# Patient Record
Sex: Female | Born: 1995
Health system: Southern US, Community
[De-identification: ages and names within clinical notes are randomized; demographics above are authoritative.]

## PROBLEM LIST (undated history)

## (undated) DIAGNOSIS — F411 Generalized anxiety disorder: Secondary | ICD-10-CM

## (undated) DIAGNOSIS — N739 Female pelvic inflammatory disease, unspecified: Secondary | ICD-10-CM

## (undated) DIAGNOSIS — F329 Major depressive disorder, single episode, unspecified: Secondary | ICD-10-CM

## (undated) DIAGNOSIS — F32A Depression, unspecified: Secondary | ICD-10-CM

## (undated) DIAGNOSIS — N946 Dysmenorrhea, unspecified: Secondary | ICD-10-CM

## (undated) DIAGNOSIS — N83202 Unspecified ovarian cyst, left side: Secondary | ICD-10-CM

## (undated) DIAGNOSIS — N83201 Unspecified ovarian cyst, right side: Secondary | ICD-10-CM

## (undated) DIAGNOSIS — N912 Amenorrhea, unspecified: Secondary | ICD-10-CM

## (undated) HISTORY — DX: Major depressive disorder, single episode, unspecified: F32.9

## (undated) HISTORY — DX: Depression, unspecified: F32.A

## (undated) HISTORY — DX: Dysmenorrhea, unspecified: N94.6

## (undated) HISTORY — DX: Amenorrhea, unspecified: N91.2

## (undated) HISTORY — DX: Female pelvic inflammatory disease, unspecified: N73.9

---

## 2018-02-20 ENCOUNTER — Encounter (HOSPITAL_COMMUNITY): Payer: Self-pay

## 2018-02-20 DIAGNOSIS — Z79899 Other long term (current) drug therapy: Secondary | ICD-10-CM | POA: Diagnosis not present

## 2018-02-20 DIAGNOSIS — R102 Pelvic and perineal pain: Secondary | ICD-10-CM | POA: Diagnosis not present

## 2018-02-20 DIAGNOSIS — R1030 Lower abdominal pain, unspecified: Secondary | ICD-10-CM | POA: Diagnosis present

## 2018-02-20 DIAGNOSIS — F411 Generalized anxiety disorder: Secondary | ICD-10-CM | POA: Diagnosis not present

## 2018-02-20 NOTE — ED Triage Notes (Signed)
Pt presents with c/o abdominal cramping. Pt reports that she has had ovarian cysts in the past and has been on birth control. Pt reports that this is her first period since she started her birth control and she is experiencing terrible cramping. Pt reports the cramping was so bad she passed out in her shower. NAD at this time. Pt denies any diarrhea, one episode of vomiting today.

## 2018-02-21 ENCOUNTER — Emergency Department (HOSPITAL_COMMUNITY)
Admission: EM | Admit: 2018-02-21 | Discharge: 2018-02-21 | Disposition: A | Discharge status: Home / Self Care | Payer: BLUE CROSS/BLUE SHIELD | Attending: Emergency Medicine | Admitting: Emergency Medicine

## 2018-02-21 ENCOUNTER — Emergency Department (HOSPITAL_COMMUNITY): Payer: BLUE CROSS/BLUE SHIELD

## 2018-02-21 DIAGNOSIS — R102 Pelvic and perineal pain: Secondary | ICD-10-CM

## 2018-02-21 HISTORY — DX: Generalized anxiety disorder: F41.1

## 2018-02-21 HISTORY — DX: Unspecified ovarian cyst, right side: N83.201

## 2018-02-21 HISTORY — DX: Unspecified ovarian cyst, right side: N83.202

## 2018-02-21 LAB — URINALYSIS, ROUTINE W REFLEX MICROSCOPIC
Bilirubin Urine: NEGATIVE
Glucose, UA: NEGATIVE mg/dL
KETONES UR: 20 mg/dL — AB
Leukocytes, UA: NEGATIVE
Nitrite: NEGATIVE
PH: 5 (ref 5.0–8.0)
PROTEIN: 30 mg/dL — AB
Specific Gravity, Urine: 1.026 (ref 1.005–1.030)

## 2018-02-21 LAB — CBC WITH DIFFERENTIAL/PLATELET
Basophils Absolute: 0 10*3/uL (ref 0.0–0.1)
Basophils Relative: 0 %
Eosinophils Absolute: 0 10*3/uL (ref 0.0–0.7)
Eosinophils Relative: 0 %
HCT: 37.1 % (ref 36.0–46.0)
Hemoglobin: 12.4 g/dL (ref 12.0–15.0)
LYMPHS ABS: 0.6 10*3/uL — AB (ref 0.7–4.0)
LYMPHS PCT: 3 %
MCH: 30.5 pg (ref 26.0–34.0)
MCHC: 33.4 g/dL (ref 30.0–36.0)
MCV: 91.4 fL (ref 78.0–100.0)
Monocytes Absolute: 0.4 10*3/uL (ref 0.1–1.0)
Monocytes Relative: 3 %
Neutro Abs: 15.5 10*3/uL — ABNORMAL HIGH (ref 1.7–7.7)
Neutrophils Relative %: 94 %
PLATELETS: 235 10*3/uL (ref 150–400)
RBC: 4.06 MIL/uL (ref 3.87–5.11)
RDW: 12.4 % (ref 11.5–15.5)
WBC: 16.6 10*3/uL — AB (ref 4.0–10.5)

## 2018-02-21 LAB — BASIC METABOLIC PANEL
Anion gap: 10 (ref 5–15)
BUN: 9 mg/dL (ref 6–20)
CHLORIDE: 106 mmol/L (ref 101–111)
CO2: 23 mmol/L (ref 22–32)
Calcium: 9.4 mg/dL (ref 8.9–10.3)
Creatinine, Ser: 0.44 mg/dL (ref 0.44–1.00)
GFR calc Af Amer: >60 mL/min (ref >60)
GFR calc non Af Amer: >60 mL/min (ref >60)
GLUCOSE: 136 mg/dL — AB (ref 65–99)
POTASSIUM: 3.8 mmol/L (ref 3.5–5.1)
SODIUM: 139 mmol/L (ref 135–145)

## 2018-02-21 LAB — PREGNANCY, URINE: Preg Test, Ur: NEGATIVE

## 2018-02-21 LAB — I-STAT TROPONIN, ED: Troponin i, poc: 0 ng/mL (ref 0.00–0.08)

## 2018-02-21 LAB — WET PREP, GENITAL
CLUE CELLS WET PREP: NONE SEEN
Sperm: NONE SEEN
TRICH WET PREP: NONE SEEN
WBC, Wet Prep HPF POC: NONE SEEN
YEAST WET PREP: NONE SEEN

## 2018-02-21 MED ORDER — LIDOCAINE HCL 1 % IJ SOLN
INTRAMUSCULAR | Status: AC
  Administered 2018-02-21: 0.9 mL
  Filled 2018-02-21: qty 20

## 2018-02-21 MED ORDER — CEFTRIAXONE SODIUM 250 MG IJ SOLR
250.0000 mg | Freq: Once | INTRAMUSCULAR | Status: AC
  Administered 2018-02-21: 250 mg via INTRAMUSCULAR
  Filled 2018-02-21: qty 250

## 2018-02-21 MED ORDER — DOXYCYCLINE HYCLATE 100 MG PO CAPS: 100.0000 mg | ORAL_CAPSULE | Freq: Two times a day (BID) | ORAL | 0 refills | Status: DC

## 2018-02-21 MED ORDER — KETOROLAC TROMETHAMINE 30 MG/ML IJ SOLN
15.0000 mg | Freq: Once | INTRAMUSCULAR | Status: AC
  Administered 2018-02-21: 15 mg via INTRAVENOUS
  Filled 2018-02-21: qty 1

## 2018-02-21 NOTE — ED Provider Notes (Addendum)
[facility]-EMERGENCY DEPT Provider Note   CSN: [id number] Arrival date & time: 02/20/18  2313     History   Chief Complaint Chief Complaint  Patient presents with  . Abdominal Cramping    HPI Elaine Robertson is a 22 y.o. female.  HPI 22 year old Caucasian female past medical history significant for ovarian cyst presents to the emergency department today for evaluation of lower abdominal and pelvic cramping.  The patient states that she has a history of ovarian cyst and has been on birth control.  She states that she stopped her birth control 2 months ago.  States that she started her period for the first time in several years today.  She reports severe cramping in her lower abdomen and pelvic region.  The patient states that she was in the shower today sitting down with the hot water running over her when the pain got so bad that she passed out.  Patient denies any head injury.  Patient presents to the ED for evaluation.  She reports normal flow.  Denies any associated vaginal discharge.  Patient states that she is sexually active with one female partner but has no concern for STD.  Patient denies any associated urinary symptoms.  Denies any associated fevers or chills.  The pain does radiate to her lower back.  Patient took ibuprofen with little relief.  Patient states her pain is approximately a 5 out of 10 at this time.  Patient does report having one episode of vomiting today that was due to the intense pain.  She states that the pain is constant however the intensity is intermittent.  Patient denies any associated chest pain, shortness of breath, lightheadedness, dizziness, vision changes, headache.  Nothing makes her symptoms better or worse.  Pt denies any fever, chill, ha, vision changes, lightheadedness, dizziness, congestion, neck pain, cp, sob, cough,  urinary symptoms, change in bowel habits, melena, hematochezia, lower extremity paresthesias.  Past Medical  History:  Diagnosis Date  . Bilateral ovarian cysts   . Generalized anxiety disorder     There are no active problems to display for this patient.   History reviewed. No pertinent surgical history.   OB History   None      Home Medications    Prior to Admission medications   Medication Sig Start Date End Date Taking? Authorizing Provider  escitalopram (LEXAPRO) 10 MG tablet Take 10 mg by mouth daily.   Yes [provider]  doxycycline (VIBRAMYCIN) 100 MG capsule Take 1 capsule (100 mg total) by mouth 2 (two) times daily. 02/21/18   Rise Mu, PA-C    Family History History reviewed. No pertinent family history.  Social History Social History   Tobacco Use  . Smoking status: Never Smoker  . Smokeless tobacco: Never Used  Substance Use Topics  . Alcohol use: Never    Frequency: Never  . Drug use: Never     Allergies   Patient has no known allergies.   Review of Systems Review of Systems  All other systems reviewed and are negative.    Physical Exam Updated Vital Signs BP 106/64 (BP Location: Left Arm)   Pulse 86   Temp 98.3 F (36.8 C) (Oral)   Resp 17   LMP 02/20/2018 (Approximate)   SpO2 98%   Physical Exam  Constitutional: She is oriented to person, place, and time. She appears well-developed and well-nourished.  Non-toxic appearance. No distress.  HENT:  Head: Normocephalic and atraumatic.  Nose: Nose normal.  Mouth/Throat: Oropharynx is clear and moist.  Eyes: Pupils are equal, round, and reactive to light. Conjunctivae are normal. Right eye exhibits no discharge. Left eye exhibits no discharge.  Neck: Normal range of motion. Neck supple.  Cardiovascular: Normal rate, regular rhythm, normal heart sounds and intact distal pulses. Exam reveals no gallop and no friction rub.  No murmur heard. Pulmonary/Chest: Effort normal and breath sounds normal. No stridor. No respiratory distress. She has no wheezes. She has no rales. She  exhibits no tenderness.  Abdominal: Soft. Bowel sounds are normal. There is tenderness (minimal to deep palpation) in the left lower quadrant. There is no rigidity, no rebound, no guarding, no CVA tenderness, no tenderness at McBurney's point and negative Murphy's sign.  Genitourinary:  Genitourinary Comments: Chaperone present for exam. No external lesions, swelling, erythema, or rash of the labia. No erythema, discharge, bleeding, or lesions noted in the vaginal vault. mild CMT tenderness. No bleeding or friability. No adnexal tenderness, mass or fullness on right. Mild pain with palpation of the adnexa on the left.. No inguinal adenopathy or hernia.    Musculoskeletal: Normal range of motion. She exhibits no tenderness.  Lymphadenopathy:    She has no cervical adenopathy.  Neurological: She is alert and oriented to person, place, and time.  Skin: Skin is warm and dry. Capillary refill takes less than 2 seconds.  Psychiatric: Her behavior is normal. Judgment and thought content normal.  Nursing note and vitals reviewed.    ED Treatments / Results  Labs (all labs ordered are listed, but only abnormal results are displayed) Labs Reviewed  BASIC METABOLIC PANEL - Abnormal; Notable for the following components:      Result Value   Glucose, Bld 136 (*)    All other components within normal limits  CBC WITH DIFFERENTIAL/PLATELET - Abnormal; Notable for the following components:   WBC 16.6 (*)    Neutro Abs 15.5 (*)    Lymphs Abs 0.6 (*)    All other components within normal limits  URINALYSIS, ROUTINE W REFLEX MICROSCOPIC - Abnormal; Notable for the following components:   APPearance CLOUDY (*)    Hgb urine dipstick MODERATE (*)    Ketones, ur 20 (*)    Protein, ur 30 (*)    Bacteria, UA MANY (*)    Squamous Epithelial / LPF 0-5 (*)    All other components within normal limits  WET PREP, GENITAL  URINE CULTURE  PREGNANCY, URINE  POC URINE PREG, ED  I-STAT TROPONIN, ED    GC/CHLAMYDIA PROBE AMP ([place]) NOT AT [facility]    EKG None  Radiology US Transvaginal Non-ob  Result Date: 02/21/2018 CLINICAL DATA:  Pelvic pain for 1 day.  History of ovarian cysts. EXAM: TRANSABDOMINAL AND TRANSVAGINAL ULTRASOUND OF PELVIS DOPPLER ULTRASOUND OF OVARIES TECHNIQUE: Both transabdominal and transvaginal ultrasound examinations of the pelvis were performed. Transabdominal technique was performed for global imaging of the pelvis including uterus, ovaries, adnexal regions, and pelvic cul-de-sac. It was necessary to proceed with endovaginal exam following the transabdominal exam to visualize the ovaries. Color and duplex Doppler ultrasound was utilized to evaluate blood flow to the ovaries. COMPARISON:  None. FINDINGS: Uterus Measurements: 7.0 x 4.3 x 5.2 cm. The uterus is retroverted. No fibroids or other mass visualized. Endometrium Thickness: 13.6 mm.  No focal abnormality visualized. Right ovary Measurements: 3.6 x 1.7 x 1.7 cm. There are physiologic follicles with normal blood flow. Adjacent to the ovary is a echogenic slightly tubular structure measuring 3.9 x 1.0  x 2.9 cm. Left ovary Measurements: 4.0 x 1.7 x 1.8 cm. Normal appearance/no adnexal mass. Normal blood flow. Pulsed Doppler evaluation of both ovaries demonstrates normal low-resistance arterial and venous waveforms. Other findings Small amount of free fluid in the left adnexa. IMPRESSION: 1. Normal sonographic appearance of both ovaries without torsion. No ovarian cyst. 2. Para ovarian echogenic structure in the right adnexa may represent edematous fallopian tube and can be seen with pelvic inflammatory disease. No visualized discrete pelvic fluid collection or abscess. 3. Retroverted uterus which is unremarkable. Electronically Signed   By: Rubye Oaks M.D.   On: 02/21/2018 02:24   US Pelvis Complete  Result Date: 02/21/2018 CLINICAL DATA:  Pelvic pain for 1 day.  History of ovarian cysts. EXAM: TRANSABDOMINAL AND  TRANSVAGINAL ULTRASOUND OF PELVIS DOPPLER ULTRASOUND OF OVARIES TECHNIQUE: Both transabdominal and transvaginal ultrasound examinations of the pelvis were performed. Transabdominal technique was performed for global imaging of the pelvis including uterus, ovaries, adnexal regions, and pelvic cul-de-sac. It was necessary to proceed with endovaginal exam following the transabdominal exam to visualize the ovaries. Color and duplex Doppler ultrasound was utilized to evaluate blood flow to the ovaries. COMPARISON:  None. FINDINGS: Uterus Measurements: 7.0 x 4.3 x 5.2 cm. The uterus is retroverted. No fibroids or other mass visualized. Endometrium Thickness: 13.6 mm.  No focal abnormality visualized. Right ovary Measurements: 3.6 x 1.7 x 1.7 cm. There are physiologic follicles with normal blood flow. Adjacent to the ovary is a echogenic slightly tubular structure measuring 3.9 x 1.0 x 2.9 cm. Left ovary Measurements: 4.0 x 1.7 x 1.8 cm. Normal appearance/no adnexal mass. Normal blood flow. Pulsed Doppler evaluation of both ovaries demonstrates normal low-resistance arterial and venous waveforms. Other findings Small amount of free fluid in the left adnexa. IMPRESSION: 1. Normal sonographic appearance of both ovaries without torsion. No ovarian cyst. 2. Para ovarian echogenic structure in the right adnexa may represent edematous fallopian tube and can be seen with pelvic inflammatory disease. No visualized discrete pelvic fluid collection or abscess. 3. Retroverted uterus which is unremarkable. Electronically Signed   By: Rubye Oaks M.D.   On: 02/21/2018 02:24   [place] Art/ven Flow Abd Pelv Doppler  Result Date: 02/21/2018 CLINICAL DATA:  Pelvic pain for 1 day.  History of ovarian cysts. EXAM: TRANSABDOMINAL AND TRANSVAGINAL ULTRASOUND OF PELVIS DOPPLER ULTRASOUND OF OVARIES TECHNIQUE: Both transabdominal and transvaginal ultrasound examinations of the pelvis were performed. Transabdominal technique was performed for  global imaging of the pelvis including uterus, ovaries, adnexal regions, and pelvic cul-de-sac. It was necessary to proceed with endovaginal exam following the transabdominal exam to visualize the ovaries. Color and duplex Doppler ultrasound was utilized to evaluate blood flow to the ovaries. COMPARISON:  None. FINDINGS: Uterus Measurements: 7.0 x 4.3 x 5.2 cm. The uterus is retroverted. No fibroids or other mass visualized. Endometrium Thickness: 13.6 mm.  No focal abnormality visualized. Right ovary Measurements: 3.6 x 1.7 x 1.7 cm. There are physiologic follicles with normal blood flow. Adjacent to the ovary is a echogenic slightly tubular structure measuring 3.9 x 1.0 x 2.9 cm. Left ovary Measurements: 4.0 x 1.7 x 1.8 cm. Normal appearance/no adnexal mass. Normal blood flow. Pulsed Doppler evaluation of both ovaries demonstrates normal low-resistance arterial and venous waveforms. Other findings Small amount of free fluid in the left adnexa. IMPRESSION: 1. Normal sonographic appearance of both ovaries without torsion. No ovarian cyst. 2. Para ovarian echogenic structure in the right adnexa may represent edematous fallopian tube and can be  seen with pelvic inflammatory disease. No visualized discrete pelvic fluid collection or abscess. 3. Retroverted uterus which is unremarkable. Electronically Signed   By: Rubye Oaks M.D.   On: 02/21/2018 02:24    Procedures Procedures (including critical care time)  Medications Ordered in ED Medications  cefTRIAXone (ROCEPHIN) injection 250 mg (250 mg Intramuscular Given 02/21/18 0438)  ketorolac (TORADOL) 30 MG/ML injection 15 mg (15 mg Intravenous Given 02/21/18 0440)  lidocaine (XYLOCAINE) 1 % (with pres) injection (0.9 mLs  Given 02/21/18 0439)     Initial Impression / Assessment and Plan / ED Course  I have reviewed the triage vital signs and the nursing notes.  Pertinent labs & imaging results that were available during my care of the patient were  reviewed by me and considered in my medical decision making (see chart for details).     Patient presents to the emergency department today for evaluation of lower abdominal cramping and pelvic pain.  Patient has a history of ovarian cyst and has been on oral birth control.  She recently stopped the birth control and started her first period today.  The patient states the pain is very severe cramping.  Reports spotting at this time.  Patient denies any associated vaginal discharge or concern for STD however she is sexually active.  Denies any associated fevers.  Does report episode of vomiting with the pain today and reports an episode of syncope while in the shower due to the intense pain.  Patient overall well-appearing and nontoxic.  Vital signs are reassuring.  Patient is afebrile, no tachycardia or hypotension noted in the ED.  Lungs clear to auscultation bilaterally.  Heart regular rate and rhythm with no rubs murmurs or gallops.  Patient has some minimal left lower quadrant pain to deep palpation otherwise no focal abdominal tenderness noted.  Bowel sounds are present in all 4 quadrants.  No signs of peritonitis.  Pelvic exam reveals mild cervical motion tenderness and some left adnexal tenderness to palpation.  No significant discharge or bleeding noted.  No CVA tenderness.  Lab work shows a leukocytosis of 16,000 with a left shift.  Elect lites are reassuring.  Normal kidney function.  UA with moderate hemoglobin consistent with patient's menstrual cycle, many bacteria with squamous epithelial cells.  There is no leukocytes and is nitrite negative.  Patient denies any urinary symptoms.  Will add culture but will not treat for UTI at this time.  Low suspicion for cystitis or pyelonephritis.  Wet prep shows no acute abnormalities.  Troponin was obtained that was normal.  I suspect the patient's syncope was due to pain given the patient has no chest pain or shortness of breath.  Ultrasound obtained  shows no signs of ovarian torsion or cyst.ara ovarian echogenic structure in the right adnexa may represent edematous fallopian tube and can be seen with pelvic inflammatory disease. No visualized discrete pelvic fluid collection or abscess.   Given the elevated white blood cell count.  Discussed imaging with patient.  Patient does not have any right lower abdominal tenderness on palpation.  Patient states that she does not want a CAT scan at this time.  Have discussed risk of not obtaining CT scan the patient is agreeable.  States that if symptoms worsen she will return the ED.  Given the ultrasound findings with some mild cervical motion tenderness will start patient on Rocephin and doxycycline.  Patient's urine culture is pending however we will not treat at this time.  Negative pregnancy test.  Low suspicion for ectopic pregnancy.  Dicussed with the patient that I cannot rule out appendicitis CT scan the patient understands this.  Pt is hemodynamically stable, in NAD, & able to ambulate in the ED. Evaluation does not show pathology that would require ongoing emergent intervention or inpatient treatment. I explained the diagnosis to the patient. Pain has been managed & has no complaints prior to dc. Pt is comfortable with above plan and is stable for discharge at this time. All questions were answered prior to disposition. Strict return precautions for f/u to the ED were discussed. Encouraged follow up with PCP.  Dicussed with my attending who is agreeable with the above plna.      Final Clinical Impressions(s) / ED Diagnoses   Final diagnoses:  Pelvic pain    ED Discharge Orders        Ordered    doxycycline (VIBRAMYCIN) 100 MG capsule  2 times daily     02/21/18 0426       Rise MuLeaphart, Sanjuan Sawa T, PA-C 02/21/18 [id number]    Shaune PollackIsaacs, Cameron, MD 02/21/18 1406    Rise MuLeaphart, Garyn Arlotta T, PA-C 03/01/18 [id number]    Shaune PollackIsaacs, Cameron, MD 03/01/18 [phone]

## 2018-02-21 NOTE — Discharge Instructions (Addendum)
Your ultrasound did not show any signs of ovarian cyst.  You do have signs of possible pelvic inflammatory disease as discussed.  However I discussed that I would like to perform a CAT scan I understand that you not want this today.  If you develop any specific right lower quadrant pain, fevers or vomiting you need to return the ED immediately to rule out appendicitis.  Have started you on antibiotics to take as prescribed.  Continue the entire course.  May take Motrin and Tylenol for pain with warm compresses to the affected area.  You need to be reevaluated in 1 week if symptoms not improving and return to ED with any worsening symptoms.

## 2018-02-22 LAB — GC/CHLAMYDIA PROBE AMP (~~LOC~~) NOT AT ARMC
Chlamydia: NEGATIVE
NEISSERIA GONORRHEA: NEGATIVE

## 2018-02-22 LAB — URINE CULTURE: CULTURE: NO GROWTH

## 2018-02-25 ENCOUNTER — Ambulatory Visit (HOSPITAL_COMMUNITY): Payer: BLUE CROSS/BLUE SHIELD | Admitting: Psychiatry

## 2018-03-13 DIAGNOSIS — N739 Female pelvic inflammatory disease, unspecified: Secondary | ICD-10-CM

## 2018-03-13 HISTORY — DX: Female pelvic inflammatory disease, unspecified: N73.9

## 2018-07-30 ENCOUNTER — Other Ambulatory Visit: Payer: Self-pay | Admitting: Physician Assistant

## 2018-07-30 DIAGNOSIS — R102 Pelvic and perineal pain: Secondary | ICD-10-CM

## 2018-08-07 ENCOUNTER — Ambulatory Visit
Admission: RE | Admit: 2018-08-07 | Discharge: 2018-08-07 | Disposition: A | Discharge status: Home / Self Care | Payer: BLUE CROSS/BLUE SHIELD | Source: Ambulatory Visit | Attending: Physician Assistant | Admitting: Physician Assistant

## 2018-08-07 DIAGNOSIS — R102 Pelvic and perineal pain: Secondary | ICD-10-CM

## 2018-09-06 ENCOUNTER — Telehealth: Payer: Self-pay | Admitting: Obstetrics and Gynecology

## 2018-09-09 ENCOUNTER — Encounter: Payer: Self-pay | Admitting: *Deleted

## 2018-09-09 ENCOUNTER — Encounter: Payer: Self-pay | Admitting: Obstetrics and Gynecology

## 2018-09-09 ENCOUNTER — Ambulatory Visit: Payer: BLUE CROSS/BLUE SHIELD | Admitting: Obstetrics and Gynecology

## 2018-09-09 ENCOUNTER — Other Ambulatory Visit: Payer: Self-pay

## 2018-09-09 VITALS — BP 108/62 | HR 88 | Resp 16 | Ht 64.0 in | Wt 133.8 lb

## 2018-09-09 DIAGNOSIS — N921 Excessive and frequent menstruation with irregular cycle: Secondary | ICD-10-CM

## 2018-09-09 DIAGNOSIS — R102 Pelvic and perineal pain: Secondary | ICD-10-CM

## 2018-09-09 DIAGNOSIS — N946 Dysmenorrhea, unspecified: Secondary | ICD-10-CM | POA: Diagnosis not present

## 2018-09-09 DIAGNOSIS — N941 Unspecified dyspareunia: Secondary | ICD-10-CM | POA: Diagnosis not present

## 2018-09-09 LAB — POCT URINE PREGNANCY: PREG TEST UR: NEGATIVE

## 2018-09-09 MED ORDER — ETONOGESTREL-ETHINYL ESTRADIOL 0.12-0.015 MG/24HR VA RING: 1.0000 | VAGINAL_RING | VAGINAL | 0 refills | Status: DC

## 2018-09-09 NOTE — Progress Notes (Signed)
22 y.o. [id number] Single Caucasian female here for abnormal pelvic ultrasound revealing Rt.ovarian cyst.    Patient complains of pain with intercourse and pelvic/back pain. Has moderate pelvic pain "24/7" per patient.  Always has cramping around her period.  Always irregular menses also since they started.  States she takes her pills on time.  One late or missed pill in the last 2 - 3 months.   Takes Ibuprofen 400 mg daily when she has pain.   Has had trips to the ER or urgent care due to pain, nausea, and vomiting.   Has used LoLoestrin and had weight gain and fluid retention.  Menses were absent.  Tried another pill, Junel, and felt a little better but did have headaches and weight gain.  Had a one day cycle around the middle of the pack with this.   States a history of eating disorder, so the weight gain has been bothersome to her.   Just started [name] and has not been on it long enough to know if it will be good for her or not.  Had a 3 week cycle at the beginning of the pack.   [facility] and her pelvic exam was painful and felt abnormal. Had pelvic US 08/07/18 showing a 3.1 x 1.7 x 2.5 cm simple right ovarian cyst.  Left ovary and uterus normal. Trace free fluid in pelvis.  Treated for presumed PID in May, 2019.  Cultures were negative per patient.  No change in partner since then.  She also had testing 3 months prior to this, and it was negative.  Just stopped Lexapro, which treated anxiety and depression. Was on for about 5 years.  Sees a therapist.  Denies HTN, migraine with aura, liver or breast disease, personal or FH of DVT/PE.  Student at [employer].   PCP:   Talmage Nap, PA  Patient's last menstrual period was 08/19/2018 (approximate).     Period Duration (Days): 9 days Period Pattern: (!) Irregular Menstrual Flow: Heavy Menstrual Control: Tampon, Other (Comment)(menstrual cup also) Menstrual Control Change Freq (Hours): every 1.5 to 2  hours on heaviest day Dysmenorrhea: (!) Severe Dysmenorrhea Symptoms: Cramping, Nausea, Diarrhea, Headache     Sexually active: Yes.   female The current method of family planning is OCP (estrogen/progesterone).    Exercising: Yes.    dance and yoga Smoker:  no  Health Maintenance: Pap:  never History of abnormal Pap:  n/a MMG:  n/a Colonoscopy:  n/a BMD:   n/a  Result  n/a TDaP:  Up to date Gardasil:   yes HIV:no Hep C:no Screening Labs:   ---   reports that she has never smoked. She has never used smokeless tobacco. She reports that she drinks alcohol. She reports that she does not use drugs.  Past Medical History:  Diagnosis Date  . Amenorrhea   . Bilateral ovarian cysts   . Depression   . Dysmenorrhea   . Generalized anxiety disorder   . PID (pelvic inflammatory disease) 03/2018   STD testing neg    History reviewed. No pertinent surgical history.  Current Outpatient Medications  Medication Sig Dispense Refill  . VIENVA 0.1-20 MG-MCG tablet Take 1 tablet by mouth daily.     No current facility-administered medications for this visit.     Family History  Problem Relation Age of Onset  . Breast cancer Mother 70       she well  . Diabetes Father  type 1  . Hypertension Maternal Grandfather   . Ovarian cancer Paternal Grandmother 68       mets and thought began in ovary  . Cancer Paternal Grandfather 22       pancreatic    Review of Systems  All other systems reviewed and are negative.   Exam:   BP 108/62 (BP Location: Right Arm, Patient Position: Sitting, Cuff Size: Normal)   Pulse 88   Resp 16   Ht 5\' 4"  (1.626 m)   Wt 133 lb 12.8 oz (60.7 kg)   LMP 08/19/2018 (Approximate)   BMI 22.97 kg/m     General appearance: alert, cooperative and appears stated age Head: Normocephalic, without obvious abnormality, atraumatic Neck: no adenopathy, supple, symmetrical, trachea midline and thyroid normal to inspection and palpation Lungs: clear to  auscultation bilaterally Heart: regular rate and rhythm Abdomen: soft, non-tender; no masses, no organomegaly Extremities: extremities normal, atraumatic, no cyanosis or edema Skin: Skin color, texture, turgor normal. No rashes or lesions Lymph nodes: Cervical, supraclavicular, and axillary nodes normal. No abnormal inguinal nodes palpated Neurologic: Grossly normal  Pelvic: External genitalia:  no lesions              Urethra:  normal appearing urethra with no masses, tenderness or lesions              Bartholins and Skenes: normal                 Vagina: normal appearing vagina with normal color and discharge, no lesions              Cervix: no lesions        Bimanual Exam:  Uterus:  normal size, contour, position, consistency, mobility, non-tender, retroverted.               Adnexa: no mass, fullness, tenderness              Rectal exam: Yes.  .  Confirms.              Anus:  normal sphincter tone, no lesions  Chaperone was present for exam.  Assessment:    Pelvic pain/dysmenorrhea/dyspareunia.  Irregular cycles on low dose and ultra low dose OCPs. Treated for presumed PID this year.  Negative cultures.  Simple ovarian cyst - right  FH breast cancer.  Mother in early 13s.   Unknown genetic test results.  FH ovarian cancer.  Paternal GM - presumed metastatic ovarian cancer.   Plan:   UPT now. Comprehensive discussion of pelvic pain.  We reviewed options of combined contraception, POP, progesterone IUDs, Depo Provera, Pollie Friar, Depo Lupron, laparoscopy.  She will opt for NuvaRing.  Instructed in use.  She may do continuous use of the ring.  We discussed Erlene Quan, and Mirena in detail - risks and benefits. HO on [place]. Cautioned against use of too much NSAID.  Do not mix regular NSAID and SSRIs. Follow up in 3 months.   ___45____ minutes face to face time of which over 50% was spent in counseling.    After visit summary provided.

## 2018-09-09 NOTE — Patient Instructions (Signed)
Ethinyl Estradiol; Etonogestrel vaginal ring What is this medicine? ETHINYL ESTRADIOL; ETONOGESTREL (ETH in il es tra DYE ole; et oh noe JES trel) vaginal ring is a flexible, vaginal ring used as a contraceptive (birth control method). This medicine combines two types of female hormones, an estrogen and a progestin. This ring is used to prevent ovulation and pregnancy. Each ring is effective for one month. This medicine may be used for other purposes; ask your health care provider or pharmacist if you have questions. COMMON BRAND NAME(S): NuvaRing What should I tell my health care provider before I take this medicine? They need to know if you have or ever had any of these conditions: -abnormal vaginal bleeding -blood vessel disease or blood clots -breast, cervical, endometrial, ovarian, liver, or uterine cancer -diabetes -gallbladder disease -heart disease or recent heart attack -high blood pressure -high cholesterol -kidney disease -liver disease -migraine headaches -stroke -systemic lupus erythematosus (SLE) -tobacco smoker -an unusual or allergic reaction to estrogens, progestins, other medicines, foods, dyes, or preservatives -pregnant or trying to get pregnant -breast-feeding How should I use this medicine? Insert the ring into your vagina as directed. Follow the directions on the prescription label. The ring will remain place for 3 weeks and is then removed for a 1-week break. A new ring is inserted 1 week after the last ring was removed, on the same day of the week. Check often to make sure the ring is still in place, especially before and after sexual intercourse. If the ring was out of the vagina for an unknown amount of time, you may not be protected from pregnancy. Perform a pregnancy test and call your doctor. Do not use more often than directed. A patient package insert for the product will be given with each prescription and refill. Read this sheet carefully each time. The  sheet may change frequently. Contact your pediatrician regarding the use of this medicine in children. Special care may be needed. This medicine has been used in female children who have started having menstrual periods. Overdosage: If you think you have taken too much of this medicine contact a poison control center or emergency room at once. NOTE: This medicine is only for you. Do not share this medicine with others. What if I miss a dose? You will need to replace your vaginal ring once a month as directed. If the ring should slip out, or if you leave it in longer or shorter than you should, contact your health care professional for advice. What may interact with this medicine? Do not take this medicine with the following medication: -dasabuvir; ombitasvir; paritaprevir; ritonavir -ombitasvir; paritaprevir; ritonavir This medicine may also interact with the following medications: -acetaminophen -antibiotics or medicines for infections, especially rifampin, rifabutin, rifapentine, and griseofulvin, and possibly penicillins or tetracyclines -aprepitant -ascorbic acid (vitamin C) -atorvastatin -barbiturate medicines, such as phenobarbital -bosentan -carbamazepine -caffeine -clofibrate -cyclosporine -dantrolene -doxercalciferol -felbamate -grapefruit juice -hydrocortisone -medicines for anxiety or sleeping problems, such as diazepam or temazepam -medicines for diabetes, including pioglitazone -modafinil -mycophenolate -nefazodone -oxcarbazepine -phenytoin -prednisolone -ritonavir or other medicines for HIV infection or AIDS -rosuvastatin -selegiline -soy isoflavones supplements -St. John's wort -tamoxifen or raloxifene -theophylline -thyroid hormones -topiramate -warfarin This list may not describe all possible interactions. Give your health care provider a list of all the medicines, herbs, non-prescription drugs, or dietary supplements you use. Also tell them if you smoke,  drink alcohol, or use illegal drugs. Some items may interact with your medicine. What should I watch for while using   this medicine? Visit your doctor or health care professional for regular checks on your progress. You will need a regular breast and pelvic exam and Pap smear while on this medicine. Use an additional method of contraception during the first cycle that you use this ring. Do not use a diaphragm or female condom, as the ring can interfere with these birth control methods and their proper placement. If you have any reason to think you are pregnant, stop using this medicine right away and contact your doctor or health care professional. If you are using this medicine for hormone related problems, it may take several cycles of use to see improvement in your condition. Smoking increases the risk of getting a blood clot or having a stroke while you are using hormonal birth control, especially if you are more than 20 years old. You are strongly advised not to smoke. This medicine can make your body retain fluid, making your fingers, hands, or ankles swell. Your blood pressure can go up. Contact your doctor or health care professional if you feel you are retaining fluid. This medicine can make you more sensitive to the sun. Keep out of the sun. If you cannot avoid being in the sun, wear protective clothing and use sunscreen. Do not use sun lamps or tanning beds/booths. If you wear contact lenses and notice visual changes, or if the lenses begin to feel uncomfortable, consult your eye care specialist. In some women, tenderness, swelling, or minor bleeding of the gums may occur. Notify your dentist if this happens. Brushing and flossing your teeth regularly may help limit this. See your dentist regularly and inform your dentist of the medicines you are taking. If you are going to have elective surgery, you may need to stop using this medicine before the surgery. Consult your health care professional  for advice. This medicine does not protect you against HIV infection (AIDS) or any other sexually transmitted diseases. What side effects may I notice from receiving this medicine? Side effects that you should report to your doctor or health care professional as soon as possible: -breast tissue changes or discharge -changes in vaginal bleeding during your period or between your periods -chest pain -coughing up blood -dizziness or fainting spells -headaches or migraines -leg, arm or groin pain -severe or sudden headaches -stomach pain (severe) -sudden shortness of breath -sudden loss of coordination, especially on one side of the body -speech problems -symptoms of vaginal infection like itching, irritation or unusual discharge -tenderness in the upper abdomen -vomiting -weakness or numbness in the arms or legs, especially on one side of the body -yellowing of the eyes or skin Side effects that usually do not require medical attention (report to your doctor or health care professional if they continue or are bothersome): -breakthrough bleeding and spotting that continues beyond the 3 initial cycles of pills -breast tenderness -mood changes, anxiety, depression, frustration, anger, or emotional outbursts -increased sensitivity to sun or ultraviolet light -nausea -skin rash, acne, or brown spots on the skin -weight gain (slight) This list may not describe all possible side effects. Call your doctor for medical advice about side effects. You may report side effects to FDA at 1-800-FDA-1088. Where should I keep my medicine? Keep out of the reach of children. Store at room temperature between 15 and 30 degrees C (59 and 86 degrees F) for up to 4 months. The product will expire after 4 months. Protect from light. Throw away any unused medicine after the expiration date. NOTE: This   sheet is a summary. It may not cover all possible information. If you have questions about this medicine, talk  to your doctor, pharmacist, or health care provider.  2018 Elsevier/Gold Standard (2016-07-07 17:00:31)  

## 2018-09-09 NOTE — Addendum Note (Signed)
Addended by: Alphonsa Overall on: 09/09/2018 11:42 AM   Modules accepted: Orders

## 2018-10-09 ENCOUNTER — Encounter: Payer: Self-pay | Admitting: Obstetrics and Gynecology

## 2018-10-23 ENCOUNTER — Other Ambulatory Visit: Payer: Self-pay | Admitting: Obstetrics and Gynecology

## 2018-10-23 MED ORDER — ETONOGESTREL-ETHINYL ESTRADIOL 0.12-0.015 MG/24HR VA RING: 1.0000 | VAGINAL_RING | VAGINAL | 0 refills | Status: DC

## 2018-10-23 NOTE — Telephone Encounter (Signed)
Patient is home for break from school and needs refills of her birth control called to [facility], [place], [place] [phone]

## 2018-10-23 NOTE — Telephone Encounter (Signed)
Medication refill request: NuvaRing Last OV:  09/09/18 - Pelvic Pain Next OV: 12/11/18 Last MMG (if hormonal medication request): n/a Refill authorized: 09/09/18 #3 w/0 refills; today please advise

## 2018-12-11 ENCOUNTER — Other Ambulatory Visit: Payer: Self-pay

## 2018-12-11 ENCOUNTER — Encounter: Payer: Self-pay | Admitting: Obstetrics and Gynecology

## 2018-12-11 ENCOUNTER — Ambulatory Visit (INDEPENDENT_AMBULATORY_CARE_PROVIDER_SITE_OTHER): Payer: BLUE CROSS/BLUE SHIELD | Admitting: Obstetrics and Gynecology

## 2018-12-11 VITALS — BP 110/72 | HR 66 | Resp 14 | Ht 65.0 in | Wt 133.1 lb

## 2018-12-11 DIAGNOSIS — Z3009 Encounter for other general counseling and advice on contraception: Secondary | ICD-10-CM | POA: Diagnosis not present

## 2018-12-11 MED ORDER — ETONOGESTREL-ETHINYL ESTRADIOL 0.12-0.015 MG/24HR VA RING
1.0000 | VAGINAL_RING | VAGINAL | 8 refills | Status: DC
Start: 2018-12-11 — End: 2019-02-28

## 2018-12-11 NOTE — Progress Notes (Signed)
GYNECOLOGY  VISIT   HPI: 23 y.o.   Single  Caucasian  female   G0P0000 with Patient's last menstrual period was 12/02/2018.   here for 3 month follow up.    Has dysmenorrhea, pelvic pain and dysmenorrhea.   Simple right ovarian cyst by US.  Had irregular menses on low dose OCPs.   Started NuvaRing after her visit on 09/09/18.  States her pain and bleeding are so much better. Bleeds for 5 days when she is off of the Ring.  Occasional spotting.  Decreasing her use of NSAIDs, and does use Ibuprofen or Aleve during the first 2 - 3 days of her cycle.  Had some headache and dizziness the first month of use, but this went away.   Really likes NuvaRing.  Leaves it in for sex.    GYNECOLOGIC HISTORY: Patient's last menstrual period was 12/02/2018. Contraception: Nuvaring Menopausal hormone therapy:  none Last mammogram: n/a Last pap smear:   n/a        OB History    Gravida  0   Para  0   Term  0   Preterm  0   AB  0   Living  0     SAB  0   TAB  0   Ectopic  0   Multiple  0   Live Births  0              Patient Active Problem List   Diagnosis Date Noted  . Pelvic pain 09/09/2018  . Dysmenorrhea 09/09/2018  . Dyspareunia, female 09/09/2018    Past Medical History:  Diagnosis Date  . Amenorrhea   . Bilateral ovarian cysts   . Depression   . Dysmenorrhea   . Generalized anxiety disorder   . PID (pelvic inflammatory disease) 03/2018   STD testing neg    History reviewed. No pertinent surgical history.  Current Outpatient Medications  Medication Sig Dispense Refill  . etonogestrel-ethinyl estradiol (NUVARING) 0.12-0.015 MG/24HR vaginal ring Place 1 each vaginally every 28 (twenty-eight) days. Insert vaginally and leave in place for 3 consecutive weeks, then remove for 1 week. 3 each 0   No current facility-administered medications for this visit.      ALLERGIES: Patient has no known allergies.  Family History  Problem Relation Age of  Onset  . Breast cancer Mother 1339       she well  . Diabetes Father        type 1  . Hypertension Maternal Grandfather   . Ovarian cancer Paternal Grandmother 68       mets and thought began in ovary  . Cancer Paternal Grandfather [id number]       pancreatic    Social History   Socioeconomic History  . Marital status: Single    Spouse name: Not on file  . Number of children: Not on file  . Years of education: Not on file  . Highest education level: Not on file  Occupational History  . Not on file  Social Needs  . Financial resource strain: Not on file  . Food insecurity:    Worry: Not on file    Inability: Not on file  . Transportation needs:    Medical: Not on file    Non-medical: Not on file  Tobacco Use  . Smoking status: Never Smoker  . Smokeless tobacco: Never Used  Substance and Sexual Activity  . Alcohol use: Yes    Frequency: Never    Comment: occ. glass  of wine  . Drug use: Never  . Sexual activity: Yes    Birth control/protection: Pill, Condom    Comment: [name]  Lifestyle  . Physical activity:    Days per week: Not on file    Minutes per session: Not on file  . Stress: Not on file  Relationships  . Social connections:    Talks on phone: Not on file    Gets together: Not on file    Attends religious service: Not on file    Active member of club or organization: Not on file    Attends meetings of clubs or organizations: Not on file    Relationship status: Not on file  . Intimate partner violence:    Fear of current or ex partner: Not on file    Emotionally abused: Not on file    Physically abused: Not on file    Forced sexual activity: Not on file  Other Topics Concern  . Not on file  Social History Narrative  . Not on file    Review of Systems  Constitutional: Negative.   HENT: Negative.   Eyes: Negative.   Respiratory: Negative.   Cardiovascular: Negative.   Gastrointestinal: Negative.   Endocrine: Negative.   Genitourinary: Negative.    Musculoskeletal: Negative.   Skin: Negative.   Allergic/Immunologic: Negative.   Neurological: Negative.   Hematological: Negative.   Psychiatric/Behavioral: Negative.     PHYSICAL EXAMINATION:    BP 110/72 (BP Location: Right Arm, Patient Position: Sitting, Cuff Size: Normal)   Pulse 66   Resp 14   Ht 5\' 5"  (1.651 m)   Wt 133 lb 1.6 oz (60.4 kg)   LMP 12/02/2018   BMI 22.15 kg/m     General appearance: alert, cooperative and appears stated age   ASSESSMENT  Nuva Ring surveillance.  Dysmenorrhea, pelvic pain and irregular bleeding controlled on current therapy. PLAN  Continue Nuva Ring x 9 more months.  FU for annual exam in October 2020.   An After Visit Summary was printed and given to the patient.  __15____ minutes face to face time of which over 50% was spent in counseling.

## 2019-02-28 ENCOUNTER — Other Ambulatory Visit: Payer: Self-pay | Admitting: Obstetrics and Gynecology

## 2019-02-28 MED ORDER — ETONOGESTREL-ETHINYL ESTRADIOL 0.12-0.015 MG/24HR VA RING
1.0000 | VAGINAL_RING | VAGINAL | 1 refills | Status: DC
Start: 2019-02-28 — End: 2019-08-18

## 2019-02-28 NOTE — Telephone Encounter (Signed)
Patient left voicemail requesting a refill for Nuvaring be sent to [facility] [address] in [place], [place].

## 2019-02-28 NOTE — Telephone Encounter (Signed)
Medication refill request: Nuvaring  Last AEX:  12/11/18 Dr. Edward Jolly  Next AEX: none Last MMG (if hormonal medication request): none Refill authorized: 12/11/18 #1each/8R. To [facility].  Patient requesting Rx to her local pharmacy please advise.

## 2019-03-05 ENCOUNTER — Telehealth: Payer: Self-pay | Admitting: Obstetrics and Gynecology

## 2019-03-05 NOTE — Telephone Encounter (Signed)
Patient is calling again requesting an update for her refill request. She sttes the [facility] tells her she has no more refills . She thinks the pharmacy is looking at an old prescription and a new Nuvoring will need to be sent to [facility] [address], [place]

## 2019-03-05 NOTE — Telephone Encounter (Signed)
Patient notified that pharmacy stated her rx is ready for her to pick up.

## 2019-03-05 NOTE — Telephone Encounter (Signed)
rx was sent electronically already.

## 2019-03-05 NOTE — Telephone Encounter (Signed)
[facility] left a message on the answering machine late yesterday asking for a refill of Nuvoring for this patient. Marland Kitchen

## 2019-08-18 ENCOUNTER — Other Ambulatory Visit: Payer: Self-pay

## 2019-08-18 MED ORDER — ETONOGESTREL-ETHINYL ESTRADIOL 0.12-0.015 MG/24HR VA RING: 1.0000 | VAGINAL_RING | VAGINAL | 0 refills | Status: DC

## 2019-08-18 NOTE — Telephone Encounter (Signed)
Medication refill request: Nuvaring Last OV: 12/11/2018 BS  Next AEX: 09/11/2019 Last MMG (if hormonal medication request): n/a Refill authorized: Pending #3 with no refills if appropriate. Please advise.

## 2019-08-22 ENCOUNTER — Other Ambulatory Visit: Payer: Self-pay | Admitting: Emergency Medicine

## 2019-08-22 DIAGNOSIS — Z20822 Contact with and (suspected) exposure to covid-19: Secondary | ICD-10-CM

## 2019-08-24 LAB — NOVEL CORONAVIRUS, NAA: SARS-CoV-2, NAA: NOT DETECTED

## 2019-09-11 ENCOUNTER — Encounter: Payer: Self-pay | Admitting: Obstetrics and Gynecology

## 2019-09-11 ENCOUNTER — Ambulatory Visit: Payer: BC Managed Care – PPO | Admitting: Obstetrics and Gynecology

## 2019-09-11 ENCOUNTER — Other Ambulatory Visit (HOSPITAL_COMMUNITY)
Admission: RE | Admit: 2019-09-11 | Discharge: 2019-09-11 | Disposition: A | Discharge status: Home / Self Care | Payer: BC Managed Care – PPO | Source: Ambulatory Visit | Attending: Obstetrics and Gynecology | Admitting: Obstetrics and Gynecology

## 2019-09-11 ENCOUNTER — Other Ambulatory Visit: Payer: Self-pay

## 2019-09-11 VITALS — BP 106/70 | HR 76 | Temp 98.0°F | Resp 14 | Ht 64.5 in | Wt 128.0 lb

## 2019-09-11 DIAGNOSIS — Z01419 Encounter for gynecological examination (general) (routine) without abnormal findings: Secondary | ICD-10-CM

## 2019-09-11 DIAGNOSIS — Z113 Encounter for screening for infections with a predominantly sexual mode of transmission: Secondary | ICD-10-CM | POA: Diagnosis present

## 2019-09-11 DIAGNOSIS — Z803 Family history of malignant neoplasm of breast: Secondary | ICD-10-CM

## 2019-09-11 DIAGNOSIS — Z8041 Family history of malignant neoplasm of ovary: Secondary | ICD-10-CM

## 2019-09-11 DIAGNOSIS — Z800 Family history of malignant neoplasm of digestive organs: Secondary | ICD-10-CM | POA: Diagnosis not present

## 2019-09-11 MED ORDER — ETONOGESTREL-ETHINYL ESTRADIOL 0.12-0.015 MG/24HR VA RING: 1.0000 | VAGINAL_RING | VAGINAL | 3 refills | Status: DC

## 2019-09-11 NOTE — Patient Instructions (Signed)

## 2019-09-11 NOTE — Progress Notes (Signed)
23 y.o. G0P0000 Single Caucasian female here for annual exam.    Studying for the Law entrance exam.   Having a period every month.  Cramping and pain controlled.  PCP:   Does not have one  Patient's last menstrual period was 08/21/2019 (exact date).           Sexually active: Yes.    The current method of family planning is NuvaRing vaginal inserts.    Exercising: Yes.    Exercise at home.  Smoker:  no  Health Maintenance: Pap: NEVER History of abnormal Pap:  n/a MMG:  n/a Colonoscopy:  n/a BMD:   n/a  Result  n/a TDaP:  2018 Gardasil:   yes HIV: today Hep C: today Screening Labs:  Declines Flu vaccine:  Completed.   reports that she has never smoked. She has never used smokeless tobacco. She reports current alcohol use. She reports that she does not use drugs.  Past Medical History:  Diagnosis Date  . Amenorrhea   . Bilateral ovarian cysts   . Depression   . Dysmenorrhea   . Generalized anxiety disorder   . PID (pelvic inflammatory disease) 03/2018   STD testing neg    No past surgical history on file.  Current Outpatient Medications  Medication Sig Dispense Refill  . etonogestrel-ethinyl estradiol (NUVARING) 0.12-0.015 MG/24HR vaginal ring Place 1 each vaginally every 28 (twenty-eight) days. Insert vaginally and leave in place for 3 consecutive weeks, then remove for 1 week. 1 each 0   No current facility-administered medications for this visit.     Family History  Problem Relation Age of Onset  . Breast cancer Mother 27       she well  . Diabetes Father        type 1  . Hypertension Maternal Grandfather   . Ovarian cancer Paternal Grandmother 41       mets and thought began in ovary  . Cancer Paternal Grandfather 33       pancreatic    Review of Systems  Constitutional: Negative.   HENT: Negative.   Eyes: Negative.   Respiratory: Negative.   Cardiovascular: Negative.   Gastrointestinal: Negative.   Endocrine: Negative.   Genitourinary:  Negative.   Musculoskeletal: Negative.   Skin: Negative.   Allergic/Immunologic: Negative.   Neurological: Negative.   Hematological: Negative.   Psychiatric/Behavioral: Negative.     Exam:   BP 106/70 (BP Location: Right Arm, Patient Position: Sitting, Cuff Size: Normal)   Pulse 76   Temp 98 F (36.7 C) (Temporal)   Resp 14   Ht 5' 4.5" (1.638 m)   Wt 128 lb (58.1 kg)   LMP 08/21/2019 (Exact Date)   BMI 21.63 kg/m     General appearance: alert, cooperative and appears stated age Head: normocephalic, without obvious abnormality, atraumatic Neck: no adenopathy, supple, symmetrical, trachea midline and thyroid normal to inspection and palpation Lungs: clear to auscultation bilaterally Breasts: normal appearance, no masses or tenderness, No nipple retraction or dimpling, No nipple discharge or bleeding, No axillary adenopathy Heart: regular rate and rhythm Abdomen: soft, non-tender; no masses, no organomegaly Extremities: extremities normal, atraumatic, no cyanosis or edema Skin: skin color, texture, turgor normal. No rashes or lesions Lymph nodes: cervical, supraclavicular, and axillary nodes normal. Neurologic: grossly normal  Pelvic: External genitalia:  no lesions              No abnormal inguinal nodes palpated.  Urethra:  normal appearing urethra with no masses, tenderness or lesions              Bartholins and Skenes: normal                 Vagina: normal appearing vagina with normal color and discharge, no lesions              Cervix: no lesions              Pap taken: Yes.   Bimanual Exam:  Uterus:  normal size, contour, position, consistency, mobility, non-tender              Adnexa: no mass, fullness, tenderness             Chaperone was present for exam.  Assessment:   Well woman visit with normal exam. FH breast cancer.  Mother in early 84s.   Unknown genetic test results.  FH ovarian cancer.  Paternal GM - presumed metastatic ovarian cancer.    Plan: Mammogram screening likely around age 7-30.  Self breast awareness reviewed. Pap and HR HPV as above. Guidelines for Calcium, Vitamin D, regular exercise program including cardiovascular and weight bearing exercise. Referral for genetic counseling and testing.  Refill of Nuvaring.  STD screening.  She declines routine labs. Follow up annually and prn.   After visit summary provided.

## 2019-09-12 LAB — HEP, RPR, HIV PANEL
HIV Screen 4th Generation wRfx: NONREACTIVE
Hepatitis B Surface Ag: NEGATIVE
RPR Ser Ql: NONREACTIVE

## 2019-09-12 LAB — HEPATITIS C ANTIBODY: Hep C Virus Ab: <0.1 s/co ratio (ref 0.0–0.9)

## 2019-09-17 LAB — CYTOLOGY - PAP
Chlamydia: NEGATIVE
Comment: NEGATIVE
Comment: NEGATIVE
Comment: NORMAL
Diagnosis: NEGATIVE
Neisseria Gonorrhea: NEGATIVE
Trichomonas: NEGATIVE

## 2019-09-24 ENCOUNTER — Telehealth: Payer: Self-pay | Admitting: *Deleted

## 2019-09-24 NOTE — Telephone Encounter (Signed)
Message left to return call to [facility] at [phone].   One year supply of nuvaring sent to [facility] on 09-05-2019. Patient needs to advise where prescription needs to be sent.

## 2019-10-06 ENCOUNTER — Other Ambulatory Visit: Payer: Self-pay

## 2019-10-06 DIAGNOSIS — Z20822 Contact with and (suspected) exposure to covid-19: Secondary | ICD-10-CM

## 2019-10-07 LAB — NOVEL CORONAVIRUS, NAA: SARS-CoV-2, NAA: NOT DETECTED

## 2019-10-07 NOTE — Telephone Encounter (Signed)
Message left to return call to Christoph Copelan at [phone].    

## 2019-10-13 NOTE — Telephone Encounter (Signed)
Patient has not returned call x2 to advise if year supply of nuvaring was sent to correct pharmacy on 09-11-2019. Okay to close encounter?   Routing to provider for review.

## 2019-10-14 NOTE — Telephone Encounter (Signed)
Encounter reviewed and closed.  

## 2019-11-28 ENCOUNTER — Encounter (HOSPITAL_COMMUNITY): Payer: Self-pay | Admitting: Psychiatry

## 2019-11-28 ENCOUNTER — Ambulatory Visit (INDEPENDENT_AMBULATORY_CARE_PROVIDER_SITE_OTHER): Payer: BC Managed Care – PPO | Admitting: Psychiatry

## 2019-11-28 ENCOUNTER — Other Ambulatory Visit: Payer: Self-pay

## 2019-11-28 DIAGNOSIS — F411 Generalized anxiety disorder: Secondary | ICD-10-CM | POA: Diagnosis not present

## 2019-11-28 DIAGNOSIS — F633 Trichotillomania: Secondary | ICD-10-CM | POA: Diagnosis not present

## 2019-11-28 MED ORDER — FLUOXETINE HCL 20 MG PO TABS: 20.0000 mg | ORAL_TABLET | Freq: Every day | ORAL | 0 refills | Status: DC

## 2019-11-28 MED ORDER — TRAZODONE HCL 50 MG PO TABS: 50.0000 mg | ORAL_TABLET | Freq: Every evening | ORAL | 0 refills | Status: DC | PRN

## 2019-11-28 NOTE — Progress Notes (Signed)
Psychiatric Initial Adult Assessment   Patient Identification: Elaine Robertson MRN:  [id number] Date of Evaluation:  11/28/2019 Referral Source: Talmage Nap PA Chief Complaint:   Chief Complaint    Establish Care; Anxiety     Interview was conducted using WebEx teleconferencing application and I verified that I was speaking with the correct person using two identifiers. I discussed the limitations of evaluation and management by telemedicine and  the availability of in person appointments. Patient expressed understanding and agreed to proceed.  Visit Diagnosis:    ICD-10-CM   1. GAD (generalized anxiety disorder)  F41.1     History of Present Illness:  Elaine Robertson is a 24 yo single white female, Junior at [employer]) who is referred to [place] by her PCP for treatment of anxiety. Patient reports having hx of treatment for generalized anxiety and depression between age of 9 and 68. She was prescribed escitalopram (up to 30 mg final dose) which worked well initially but after about two years effectiveness dropped off despite dose increase. She was also in counseling at that time.  Her insurance changed and her psychiatrist was no longer in network so she stopped seeing him. She was doing well untill COVID started. Over past year she has been more anxious, worrying, ruminating about health, safety. Shje would fall asleep easily but she would wake up after few hours and go checking doors, windows. In the past when she felt stress leave being higher she would start to pull her hair. This would happen on and off. As her symptoms have been present for months now and not remitting she decided to seek both counseling and medication management. She has no hx of mania, psychosis, suicidal thoughts/attempts. She does not abuse alcohol or drugs.  Her father has a hx of alcohol problem drinking (in recovery) and bipolar disorder.   Associated Signs/Symptoms: Depression Symptoms:   anxiety, disturbed sleep, decreased appetite, (Hypo) Manic Symptoms:  None Anxiety Symptoms:  Excessive Worry, Obsessive Compulsive Symptoms:   Checking,, Psychotic Symptoms:  None PTSD Symptoms: Negative  Past Psychiatric History: See above.   Previous Psychotropic Medications: Yes   Substance Abuse History in the last 12 months:  No.  Consequences of Substance Abuse: NA  Past Medical History:  Past Medical History:  Diagnosis Date  . Amenorrhea   . Bilateral ovarian cysts   . Depression   . Dysmenorrhea   . Generalized anxiety disorder   . PID (pelvic inflammatory disease) 03/2018   STD testing neg   History reviewed. No pertinent surgical history.  Family Psychiatric History: Reviewed.  Family History:  Family History  Problem Relation Age of Onset  . Breast cancer Mother 20       she well  . Diabetes Father        type 1  . Alcohol abuse Father   . Bipolar disorder Father   . Hypertension Maternal Grandfather   . Ovarian cancer Paternal Grandmother 68       mets and thought began in ovary  . Cancer Paternal Grandfather 39       pancreatic    Social History:   Social History   Socioeconomic History  . Marital status: Single    Spouse name: Not on file  . Number of children: 0  . Years of education: Not on file  . Highest education level: Not on file  Occupational History  . Occupation: Consulting civil engineer  Tobacco Use  . Smoking status: Never Smoker  . Smokeless tobacco: Never Used  Substance and Sexual Activity  . Alcohol use: Yes    Comment: occ. glass of wine  . Drug use: Never  . Sexual activity: Yes    Birth control/protection: Condom, Inserts    Comment: Elaine Robertson  Other Topics Concern  . Not on file  Social History Narrative   Lives in a house off campus with 3 roommates.   Social Determinants of Health   Financial Resource Strain:   . Difficulty of Paying Living Expenses: Not on file  Food Insecurity:   . Worried About Charity fundraiser  in the Last Year: Not on file  . Ran Out of Food in the Last Year: Not on file  Transportation Needs:   . Lack of Transportation (Medical): Not on file  . Lack of Transportation (Non-Medical): Not on file  Physical Activity:   . Days of Exercise per Week: Not on file  . Minutes of Exercise per Session: Not on file  Stress:   . Feeling of Stress : Not on file  Social Connections:   . Frequency of Communication with Friends and Family: Not on file  . Frequency of Social Gatherings with Friends and Family: Not on file  . Attends Religious Services: Not on file  . Active Member of Clubs or Organizations: Not on file  . Attends Archivist Meetings: Not on file  . Marital Status: Not on file    Additional Social History: Single, no children. She has a younger brother who is a Ship broker at [employer]. She is from [place], plans to go to [employer]. There is no hx of trauma. She works on campus Freight forwarder), lives off campus in a house with three roommates.  Allergies:  No Known Allergies  Metabolic Disorder Labs: No results found for: HGBA1C, MPG No results found for: PROLACTIN No results found for: CHOL, TRIG, HDL, CHOLHDL, VLDL, LDLCALC No results found for: TSH  Therapeutic Level Labs: No results found for: LITHIUM No results found for: CBMZ No results found for: VALPROATE  Current Medications: Current Outpatient Medications  Medication Sig Dispense Refill  . etonogestrel-ethinyl estradiol (NUVARING) 0.12-0.015 MG/24HR vaginal ring Place 1 each vaginally every 28 (twenty-eight) days. Insert vaginally and leave in place for 3 consecutive weeks, then remove for 1 week. 3 each 3  . FLUoxetine (PROZAC) 20 MG tablet Take 1 tablet (20 mg total) by mouth daily. 30 tablet 0  . traZODone (DESYREL) 50 MG tablet Take 1 tablet (50 mg total) by mouth at bedtime as needed for sleep. 30 tablet 0   No current facility-administered medications for this visit.     Psychiatric Specialty  Exam: Review of Systems  Psychiatric/Behavioral: Positive for sleep disturbance. The patient is nervous/anxious.   All other systems reviewed and are negative.   There were no vitals taken for this visit.There is no height or weight on file to calculate BMI.  General Appearance: Casual and Well Groomed  Eye Contact:  Good  Speech:  Clear and Coherent and Normal Rate  Volume:  Normal  Mood:  Anxious  Affect:  Full Range  Thought Process:  Goal Directed and Linear  Orientation:  Full (Time, Place, and Person)  Thought Content:  Obsessions and Rumination  Suicidal Thoughts:  No  Homicidal Thoughts:  No  Memory:  Immediate;   Good Recent;   Good Remote;   Good  Judgement:  Good  Insight:  Good  Psychomotor Activity:  Normal  Concentration:  Concentration: Good  Recall:  Good  Fund of Knowledge:Good  Language: Good  Akathisia:  Negative  Handed:  Right  AIMS (if indicated):  not done  Assets:  Communication Skills Desire for Improvement Financial Resources/Insurance Housing Physical Health Social Support Talents/Skills Transportation Vocational/Educational  ADL's:  Intact  Cognition: WNL  Sleep:  Fair    Assessment and Plan: 24 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 Patient reports having hx of treatment for generalized anxiety and depression between age of 9 and 32. She was prescribed escitalopram (up to 30 mg final dose) which worked well initially but after about two years effectiveness dropped off despite dose increase. She was also in counseling at that time.  Her insurance changed and her psychiatrist was no longer in network so she stopped seeing him. She was doing well untill COVID started. Over past year she has been more anxious, worrying, ruminating about health, safety. Shje would fall asleep easily but she would wake up after few hours and go checking doors, windows. In the  past when she felt stress leave being higher she would start to pull her hair. This would happen on and off. As her symptoms have been present for months now and not remitting she decided to seek both counseling and medication management. She has no hx of mania, psychosis, suicidal thoughts/attempts. She does not abuse alcohol or drugs.  Dx: GAD (suspicion of Obsessive-compulsive disorder); Trichotillomania by hx.  Plan: Because of some sedation Mayra would prefer to try a different medication - we will start fluoxetine 20 mg daily as well as trazodone 50 mg at HS porn middle insomnia. We will likely need to increase dose of fluoxetine to 40 mg to control obsessive/compulsive sx if it is well tolerated. I will ask for an appointment with one of our counselors for the patient (she has been having difficulty finding available counselor on campus). Next visit with me in one month. The plan was discussed with patient who had an opportunity to ask questions and these were all answered. I spend 60 minutes in videoconferencing with the patient and devoted approximately 50% of this time to explanation of diagnosis, discussion of treatment options and med education.  Magdalene Patricia, MD 1/15/20219:32 AM

## 2019-12-29 ENCOUNTER — Ambulatory Visit (INDEPENDENT_AMBULATORY_CARE_PROVIDER_SITE_OTHER): Payer: BC Managed Care – PPO | Admitting: Psychiatry

## 2019-12-29 ENCOUNTER — Other Ambulatory Visit: Payer: Self-pay

## 2019-12-29 DIAGNOSIS — F633 Trichotillomania: Secondary | ICD-10-CM | POA: Insufficient documentation

## 2019-12-29 DIAGNOSIS — F411 Generalized anxiety disorder: Secondary | ICD-10-CM | POA: Diagnosis not present

## 2019-12-29 MED ORDER — FLUOXETINE HCL 40 MG PO CAPS: 40.0000 mg | ORAL_CAPSULE | Freq: Every day | ORAL | 2 refills | Status: DC

## 2019-12-29 MED ORDER — TRAZODONE HCL 50 MG PO TABS: 50.0000 mg | ORAL_TABLET | Freq: Every evening | ORAL | 1 refills | Status: DC | PRN

## 2019-12-29 NOTE — Progress Notes (Signed)
BH MD/PA/NP OP Progress Note  12/29/2019 10:19 AM Elaine Robertson  MRN:  [id number] Interview was conducted by phone and I verified that I was speaking with the correct person using two identifiers. I discussed the limitations of evaluation and management by telemedicine and  the availability of in person appointments. Patient expressed understanding and agreed to proceed.  Chief Complaint: Anxiety, hair pulling.  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 Patient reports having hx of treatment for generalized anxiety and depression between age of 74 and 62. She was prescribed escitalopram (up to 30 mg final dose) which worked well initially but after about two years effectiveness dropped off despite dose increase. She was also in counseling at that time.  Her insurance changed and her psychiatrist was no longer in network so she stopped seeing him. She was doing well untill COVID started. Over past year she has been more anxious, worrying, ruminating about health, safety. She would fall asleep easily but she would wake up after few hours and go checking doors, windows. In the past when she felt stress leave being higher she would start to pull her hair. 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 She has no hx of mania, psychosis, suicidal thoughts/attempts. She does not abuse alcohol or drugs. We have added fluoxetine 20 mg and trazodone 50 mg at HS. Sleep improved but anxiety did not. Unlike escitalopram, fluoxetine does not cause her to feel sedated. Elaine Robertson will start counseling on campus.  Visit Diagnosis:    ICD-10-CM   1. GAD (generalized anxiety disorder)  F41.1   2. Trichotillomania  F63.3     Past Psychiatric History: Please see intake H&P.  Past Medical History:  Past Medical  History:  Diagnosis Date  . Amenorrhea   . Bilateral ovarian cysts   . Depression   . Dysmenorrhea   . Generalized anxiety disorder   . PID (pelvic inflammatory disease) 03/2018   STD testing neg   No past surgical history on file.  Family Psychiatric History: Reviewed.  Family History:  Family History  Problem Relation Age of Onset  . Breast cancer Mother 6       she well  . Diabetes Father        type 1  . Alcohol abuse Father   . Bipolar disorder Father   . Hypertension Maternal Grandfather   . Ovarian cancer Paternal Grandmother 68       mets and thought began in ovary  . Cancer Paternal Grandfather 30       pancreatic    Social History:  Social History   Socioeconomic History  . Marital status: Single    Spouse name: Not on file  . Number of children: 0  . Years of education: Not on file  . Highest education level: Not on file  Occupational History  . Occupation: Consulting civil engineer  Tobacco Use  . Smoking status: Never Smoker  . Smokeless tobacco: Never Used  Substance and Sexual Activity  . Alcohol use: Yes    Comment: occ. glass of wine  . Drug use: Never  . Sexual activity: Yes    Birth control/protection: Condom, Inserts    Comment: [name]  Other Topics Concern  . Not on file  Social History Narrative   Lives in a house  off campus with 3 roommates.   Social Determinants of Health   Financial Resource Strain:   . Difficulty of Paying Living Expenses: Not on file  Food Insecurity:   . Worried About Programme researcher, broadcasting/film/video in the Last Year: Not on file  . Ran Out of Food in the Last Year: Not on file  Transportation Needs:   . Lack of Transportation (Medical): Not on file  . Lack of Transportation (Non-Medical): Not on file  Physical Activity:   . Days of Exercise per Week: Not on file  . Minutes of Exercise per Session: Not on file  Stress:   . Feeling of Stress : Not on file  Social Connections:   . Frequency of Communication with Friends and Family:  Not on file  . Frequency of Social Gatherings with Friends and Family: Not on file  . Attends Religious Services: Not on file  . Active Member of Clubs or Organizations: Not on file  . Attends Banker Meetings: Not on file  . Marital Status: Not on file    Allergies: No Known Allergies  Metabolic Disorder Labs: No results found for: HGBA1C, MPG No results found for: PROLACTIN No results found for: CHOL, TRIG, HDL, CHOLHDL, VLDL, LDLCALC No results found for: TSH  Therapeutic Level Labs: No results found for: LITHIUM No results found for: VALPROATE No components found for:  CBMZ  Current Medications: Current Outpatient Medications  Medication Sig Dispense Refill  . etonogestrel-ethinyl estradiol (NUVARING) 0.12-0.015 MG/24HR vaginal ring Place 1 each vaginally every 28 (twenty-eight) days. Insert vaginally and leave in place for 3 consecutive weeks, then remove for 1 week. 3 each 3  . FLUoxetine (PROZAC) 40 MG capsule Take 1 capsule (40 mg total) by mouth daily. 30 capsule 2  . traZODone (DESYREL) 50 MG tablet Take 1 tablet (50 mg total) by mouth at bedtime as needed for sleep. 30 tablet 1   No current facility-administered medications for this visit.      Psychiatric Specialty Exam: Review of Systems  Psychiatric/Behavioral: Positive for sleep disturbance. The patient is nervous/anxious.   All other systems reviewed and are negative.   There were no vitals taken for this visit.There is no height or weight on file to calculate BMI.  General Appearance: NA  Eye Contact:  NA  Speech:  Clear and Coherent and Normal Rate  Volume:  Normal  Mood:  Anxious  Affect:  NA  Thought Process:  Goal Directed and Linear  Orientation:  Full (Time, Place, and Person)  Thought Content: Rumination   Suicidal Thoughts:  No  Homicidal Thoughts:  No  Memory:  Immediate;   Good Recent;   Good Remote;   Good  Judgement:  Good  Insight:  Good  Psychomotor Activity:  NA   Concentration:  Concentration: Good  Recall:  Good  Fund of Knowledge: Good  Language: Good  Akathisia:  Negative  Handed:  Right  AIMS (if indicated): not done  Assets:  Communication Skills Desire for Improvement Financial Resources/Insurance Housing Physical Health Social Support Talents/Skills Vocational/Educational  ADL's:  Intact  Cognition: WNL  Sleep:  Fair   Assessment and Plan: 24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 Patient reports having hx of treatment for generalized anxiety and depression between age of 13 and 5. She was prescribed escitalopram (up to 30 mg final dose) which worked well initially but after about two years  effectiveness dropped off despite dose increase. She was also in counseling at that time.  Her insurance changed and her psychiatrist was no longer in network so she stopped seeing him. She was doing well untill COVID started. Over past year she has been more anxious, worrying, ruminating about health, safety. She would fall asleep easily but she would wake up after few hours and go checking doors, windows. In the past when she felt stress leave being higher she would start to pull her hair. 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 She has no hx of mania, psychosis, suicidal thoughts/attempts. She does not abuse alcohol or drugs. We have added fluoxetine 20 mg and trazodone 50 mg at HS. Sleep improved but anxiety did not. Unlike escitalopram, fluoxetine does not cause her to feel sedated. Aerika will start counseling on campus.  Dx: GAD (suspicion of Obsessive-compulsive disorder); Trichotillomania.  Plan: We will increase fluoxetine to 40 mg daily and continue trazodone 50 mg at HS prn middle insomnia. Next visit with me in two months. The plan was  discussed with patient who had an opportunity to ask questions and these were all answered. I spend 25 minutes in phone consultation with the patient.     Stephanie Acre, MD 12/29/2019, 10:19 AM

## 2020-01-25 IMAGING — US US PELVIS COMPLETE
1 series · 13 of 25 positions shown · non-contrast
Comparison: None.

CLINICAL DATA: Pelvic pain for 1 day.  History of ovarian cysts.

EXAM:
TRANSABDOMINAL AND TRANSVAGINAL ULTRASOUND OF PELVIS
DOPPLER ULTRASOUND OF OVARIES
TECHNIQUE: Both transabdominal and transvaginal ultrasound examinations of the
pelvis were performed. Transabdominal technique was performed for
global imaging of the pelvis including uterus, ovaries, adnexal
regions, and pelvic cul-de-sac.
It was necessary to proceed with endovaginal exam following the
transabdominal exam to visualize the ovaries. Color and duplex
Doppler ultrasound was utilized to evaluate blood flow to the
ovaries.

[Series 1: us pelvis complete · 13 of 92 slices shown]
[im 1/92]
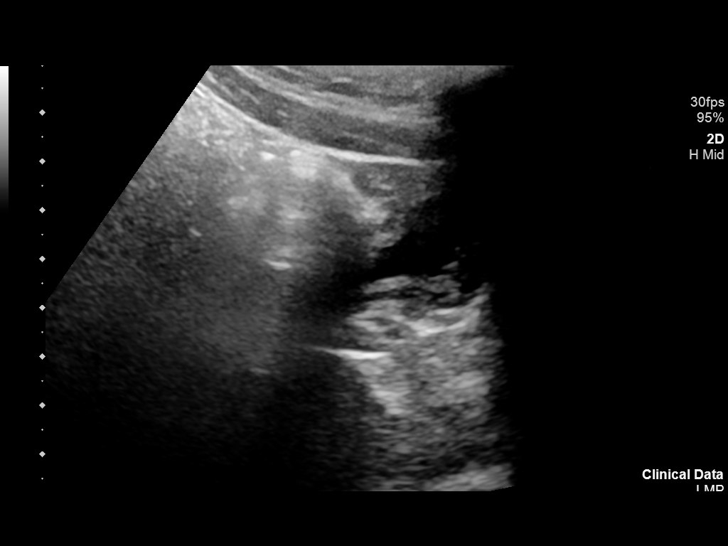
[im 8/92]
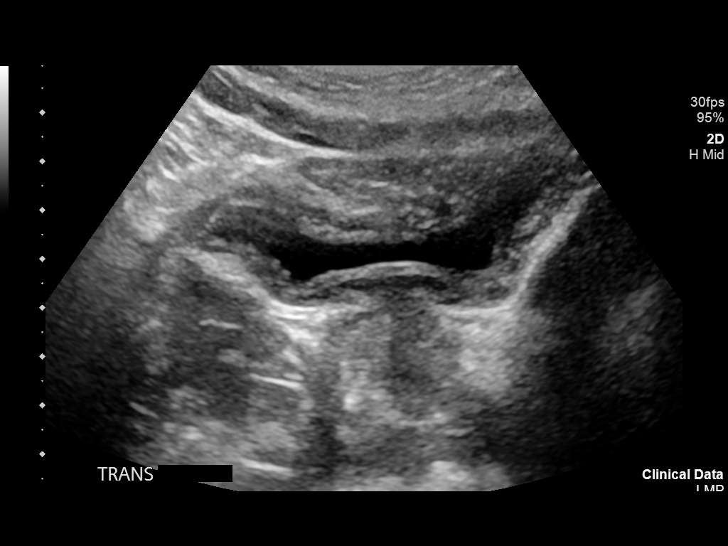
[im 16/92]
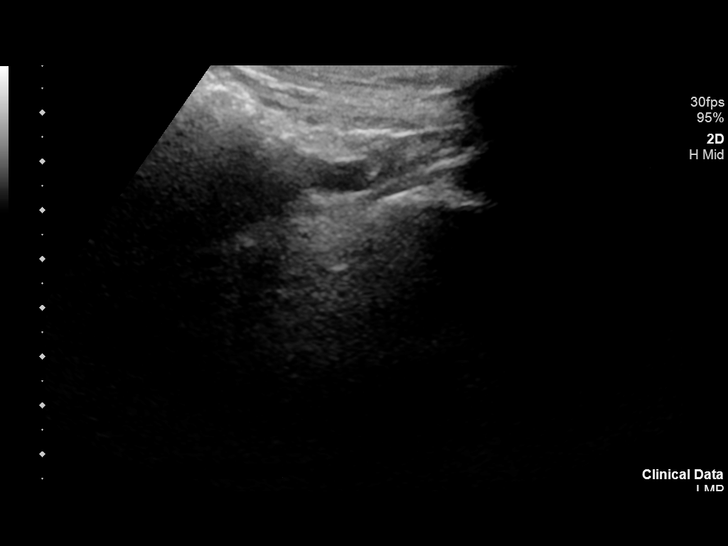
[im 23/92]
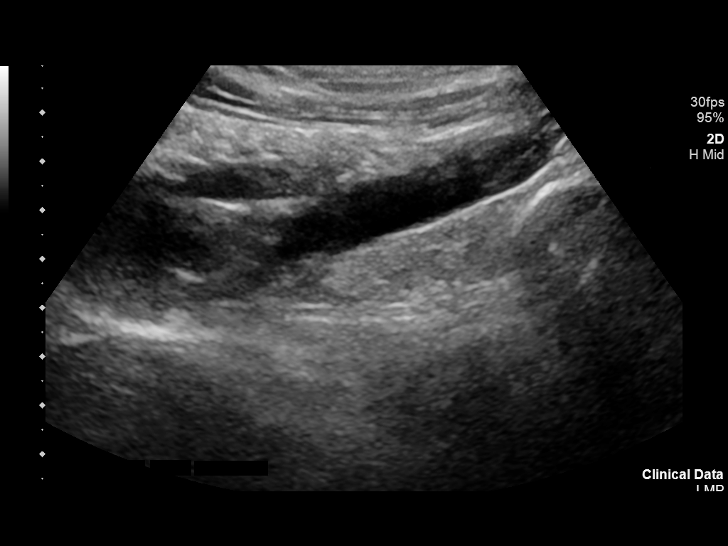
[im 31/92]
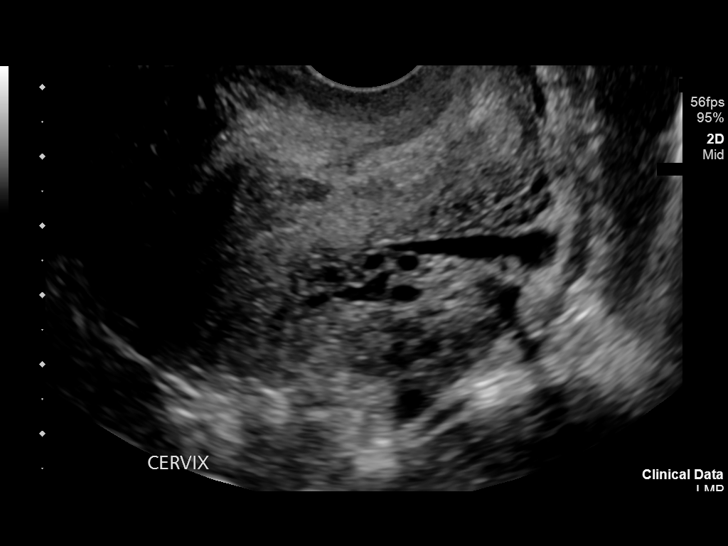
[im 38/92]
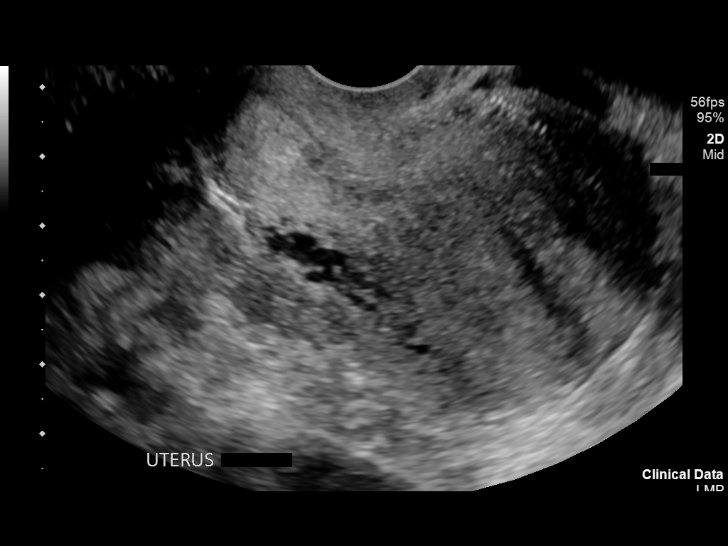
[im 46/92]
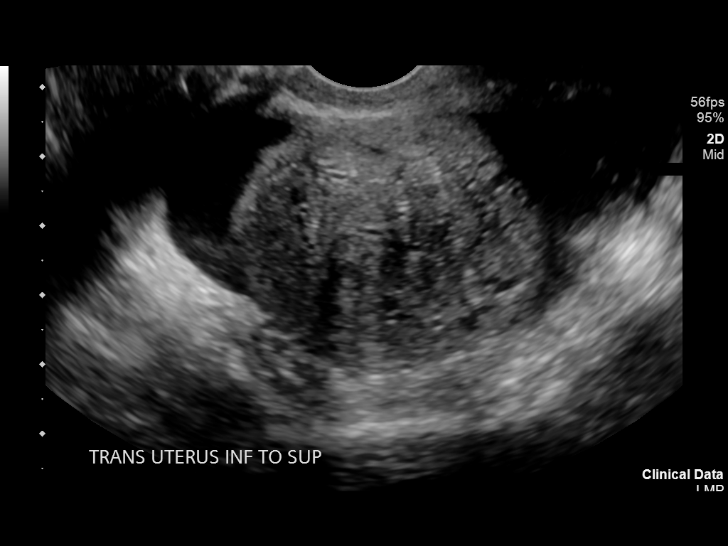
[im 54/92]
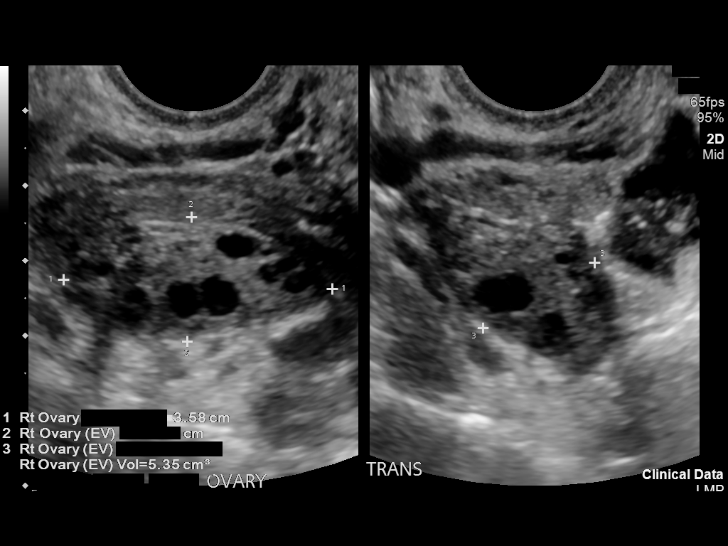
[im 61/92]
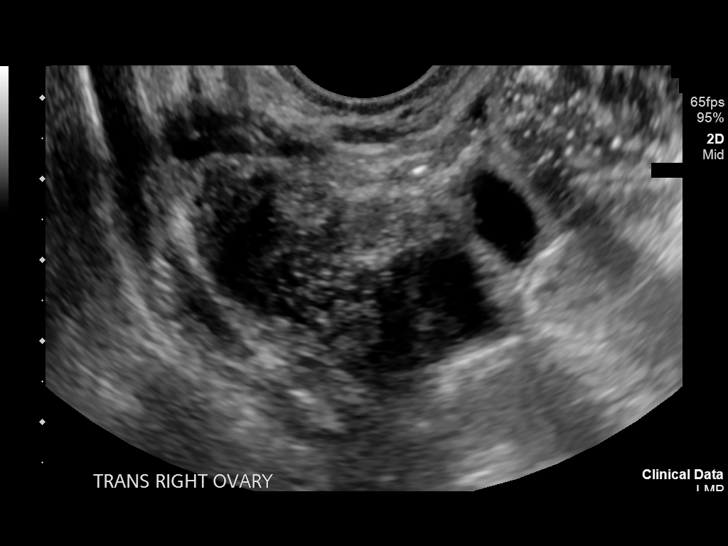
[im 69/92]
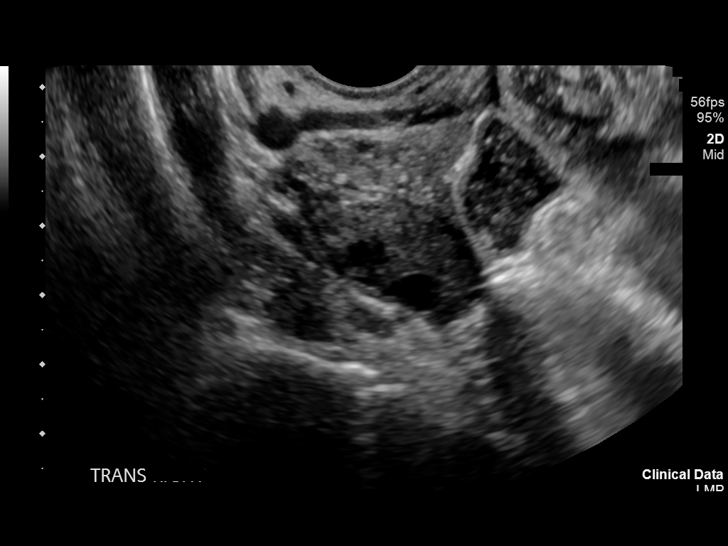
[im 76/92]
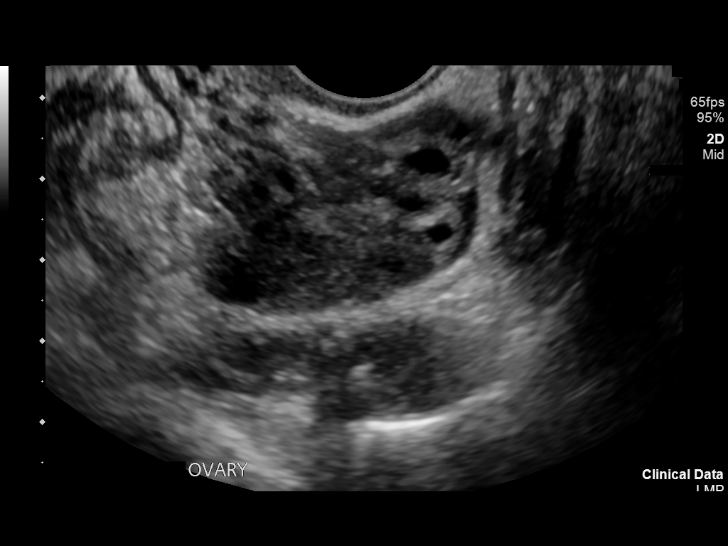
[im 84/92]
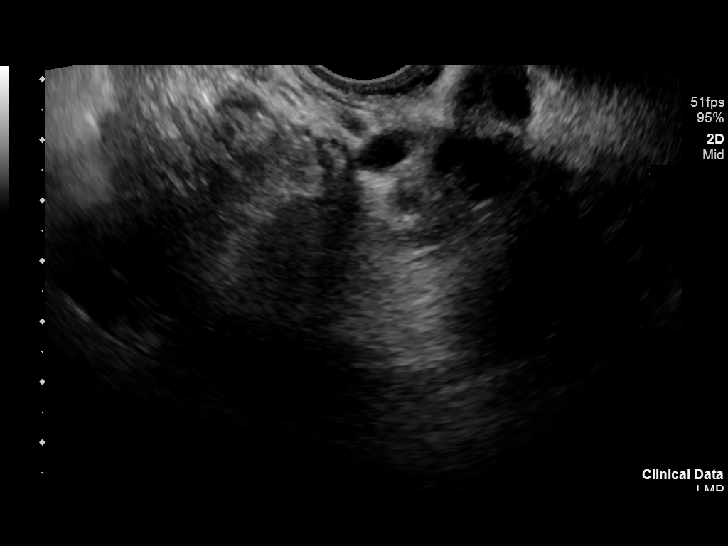
[im 92/92]
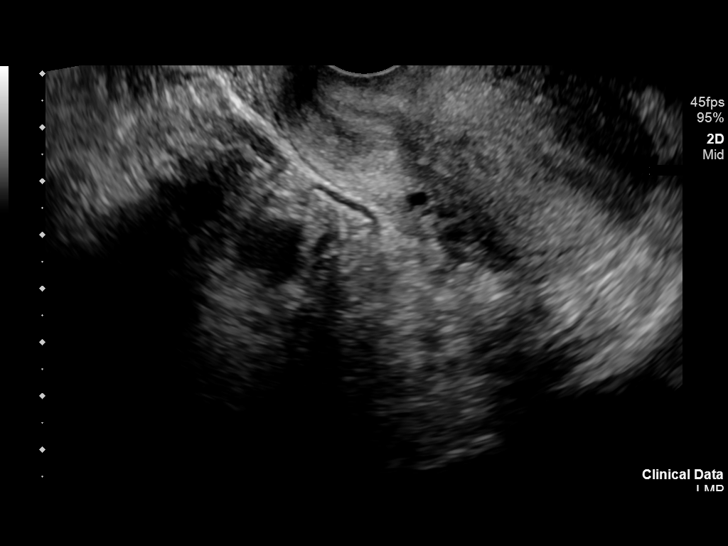

[13 of 25 positions shown; findings below may reference images not displayed]

FINDINGS: Uterus

Measurements: 7.0 x 4.3 x 5.2 cm. The uterus is retroverted. No
fibroids or other mass visualized.

Endometrium

Thickness: 13.6 mm.  No focal abnormality visualized.

Right ovary

Measurements: 3.6 x 1.7 x 1.7 cm.. There are physiologic follicles
with normal blood flow. Adjacent to the ovary is a echogenic
slightly tubular structure measuring 3.9 x 1.0 x 2.9 cm.

Left ovary

Measurements: 4.0 x 1.7 x 1.8 cm. Normal appearance/no adnexal mass.
Normal blood flow.

Pulsed Doppler evaluation of both ovaries demonstrates normal
low-resistance arterial and venous waveforms.

Other findings

Small amount of free fluid in the left adnexa.
IMPRESSION: 1. Normal sonographic appearance of both ovaries without torsion. No
ovarian cyst.
2. Para ovarian echogenic structure in the right adnexa may
represent edematous fallopian tube and can be seen with pelvic
inflammatory disease. No visualized discrete pelvic fluid collection
or abscess.
3. Retroverted uterus which is unremarkable.

## 2020-02-25 ENCOUNTER — Other Ambulatory Visit: Payer: Self-pay

## 2020-02-25 ENCOUNTER — Ambulatory Visit (INDEPENDENT_AMBULATORY_CARE_PROVIDER_SITE_OTHER): Payer: BC Managed Care – PPO | Admitting: Psychiatry

## 2020-02-25 DIAGNOSIS — F411 Generalized anxiety disorder: Secondary | ICD-10-CM | POA: Diagnosis not present

## 2020-02-25 DIAGNOSIS — F633 Trichotillomania: Secondary | ICD-10-CM | POA: Diagnosis not present

## 2020-02-25 MED ORDER — FLUOXETINE HCL 20 MG PO CAPS: 60.0000 mg | ORAL_CAPSULE | Freq: Every day | ORAL | 2 refills | Status: DC

## 2020-02-25 MED ORDER — TRAZODONE HCL 50 MG PO TABS: 50.0000 mg | ORAL_TABLET | Freq: Every evening | ORAL | 2 refills | Status: DC | PRN

## 2020-02-25 NOTE — Progress Notes (Signed)
BH MD/PA/NP OP Progress Note  02/25/2020 2:08 PM Elaine Robertson  MRN:  [id number] Interview was conducted by phone and I verified that I was speaking with the correct person using two identifiers. I discussed the limitations of evaluation and management by telemedicine and  the availability of in person appointments. Patient expressed understanding and agreed to proceed.  Chief Complaint: "Things are better".  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 Patient reports having hx of treatment for generalized anxiety and depression between age of 80 and 17. She was prescribed escitalopram (up to 30 mg final dose) which worked well initially but after about two years effectiveness dropped off despite dose increase. She was also in counseling at that time. Her insurance changed and her psychiatrist was no longer in network so she stopped seeing him. She was doing well untill COVID started. Over past year she has been more anxious, worrying, ruminating about health, safety. She would fall asleep easily but she would wake up after few hours and go checking doors, windows. In the past when she felt stress leave being higher she would start to pull her hair. 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 She has no hx of mania, psychosis, suicidal thoughts/attempts. She does not abuse alcohol or drugs. We have added fluoxetine 20 mg and trazodone 50 mg at HS. Sleep improved but anxiety did not so we increased dose of fluoxetine to 40 mg. Anxiety/obsessions decreased but not resolved fully. Unlike escitalopram, fluoxetine does not cause her to feel sedated.   Visit Diagnosis:    ICD-10-CM   1. GAD (generalized anxiety disorder)  F41.1   2. Trichotillomania  F63.3     Past Psychiatric History: Please see  intake H&P.  Past Medical History:  Past Medical History:  Diagnosis Date  . Amenorrhea   . Bilateral ovarian cysts   . Depression   . Dysmenorrhea   . Generalized anxiety disorder   . PID (pelvic inflammatory disease) 03/2018   STD testing neg   No past surgical history on file.  Family Psychiatric History: Reviewed.  Family History:  Family History  Problem Relation Age of Onset  . Breast cancer Mother 55       she well  . Diabetes Father        type 1  . Alcohol abuse Father   . Bipolar disorder Father   . Hypertension Maternal Grandfather   . Ovarian cancer Paternal Grandmother 68       mets and thought began in ovary  . Cancer Paternal Grandfather 79       pancreatic    Social History:  Social History   Socioeconomic History  . Marital status: Single    Spouse name: Not on file  . Number of children: 0  . Years of education: Not on file  . Highest education level: Not on file  Occupational History  . Occupation: Consulting civil engineer  Tobacco Use  . Smoking status: Never Smoker  . Smokeless tobacco: Never Used  Substance and Sexual Activity  . Alcohol use: Yes    Comment: occ. glass of wine  . Drug use: Never  . Sexual activity: Yes    Birth control/protection: Condom, Inserts    Comment: [name]  Other Topics Concern  . Not on file  Social History Narrative   Lives in a house off campus with 3 roommates.   Social Determinants of Health   Financial Resource Strain:   . Difficulty of Paying Living Expenses:   Food Insecurity:   . Worried About Charity fundraiser in the Last Year:   . Arboriculturist in the Last Year:   Transportation Needs:   . Film/video editor (Medical):   Marland Kitchen Lack of Transportation (Non-Medical):   Physical Activity:   . Days of Exercise per Week:   . Minutes of Exercise per Session:   Stress:   . Feeling of Stress :   Social Connections:   . Frequency of Communication with Friends and Family:   . Frequency of Social Gatherings  with Friends and Family:   . Attends Religious Services:   . Active Member of Clubs or Organizations:   . Attends Archivist Meetings:   Marland Kitchen Marital Status:     Allergies: No Known Allergies  Metabolic Disorder Labs: No results found for: HGBA1C, MPG No results found for: PROLACTIN No results found for: CHOL, TRIG, HDL, CHOLHDL, VLDL, LDLCALC No results found for: TSH  Therapeutic Level Labs: No results found for: LITHIUM No results found for: VALPROATE No components found for:  CBMZ  Current Medications: Current Outpatient Medications  Medication Sig Dispense Refill  . etonogestrel-ethinyl estradiol (NUVARING) 0.12-0.015 MG/24HR vaginal ring Place 1 each vaginally every 28 (twenty-eight) days. Insert vaginally and leave in place for 3 consecutive weeks, then remove for 1 week. 3 each 3  . FLUoxetine (PROZAC) 20 MG capsule Take 3 capsules (60 mg total) by mouth daily. 90 capsule 2  . traZODone (DESYREL) 50 MG tablet Take 1 tablet (50 mg total) by mouth at bedtime as needed for sleep. 30 tablet 2   No current facility-administered medications for this visit.     Psychiatric Specialty Exam: Review of Systems  Psychiatric/Behavioral: The patient is nervous/anxious.   All other systems reviewed and are negative.   There were no vitals taken for this visit.There is no height or weight on file to calculate BMI.  General Appearance: NA  Eye Contact:  NA  Speech:  Clear and Coherent and Normal Rate  Volume:  Normal  Mood:  Anxious  Affect:  NA  Thought Process:  Goal Directed and Linear  Orientation:  Full (Time, Place, and Person)  Thought Content: Rumination   Suicidal Thoughts:  No  Homicidal Thoughts:  No  Memory:  Immediate;   Good Recent;   Good Remote;   Good  Judgement:  Good  Insight:  Good  Psychomotor Activity:  NA  Concentration:  Concentration: Good  Recall:  Good  Fund of Knowledge: Good  Language: Good  Akathisia:  Negative  Handed:  Right   AIMS (if indicated): not done  Assets:  Communication Skills Desire for Improvement Financial Resources/Insurance Housing Physical Health Social Support Talents/Skills  ADL's:  Intact  Cognition: WNL  Sleep:  Good    Assessment and Plan: 24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 Patient reports having hx of treatment for generalized anxiety and depression between age of 22 and 87. She was prescribed escitalopram (up to 30 mg final dose) which worked well initially but after about two years effectiveness dropped off despite dose increase. She was also in counseling at that time. Her insurance changed and her psychiatrist was no longer in  network so she stopped seeing him. She was doing well untill COVID started. Over past year she has been more anxious, worrying, ruminating about health, safety. She would fall asleep easily but she would wake up after few hours and go checking doors, windows. In the past when she felt stress leave being higher she would start to pull her hair. 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 She has no hx of mania, psychosis, suicidal thoughts/attempts. She does not abuse alcohol or drugs. We have added fluoxetine 20 mg and trazodone 50 mg at HS. Sleep improved but anxiety did not so we increased dose of fluoxetine to 40 mg. Anxiety/obsessions decreased but not resolved fully. Unlike escitalopram, fluoxetine does not cause her to feel sedated.   Dx: GAD (suspicion of Obsessive-compulsive disorder); Trichotillomania by hx.  Plan: We will increase fluoxetine further to 60 mg daily and continue trazodone 50 mg at HS prn middle insomnia. Next visit with me in two months.The plan was discussed with patient who had an opportunity to ask questions and these were all answered. I  spend29minutes in phone consultationwith the patient.     Magdalene Patricia, MD 02/25/2020, 2:08 PM

## 2020-05-03 ENCOUNTER — Telehealth: Payer: Self-pay | Admitting: Obstetrics and Gynecology

## 2020-05-03 NOTE — Telephone Encounter (Signed)
Patient is having painful heavy periods and weight gain.

## 2020-05-03 NOTE — Telephone Encounter (Signed)
Spoke with patient. Patient reports menses are becoming more painful and heavy like they were prior to Nuvaring, started 2-3 mo ago. Changes a pad q4-6hrs on her heaviest days. Reports increase in "water retention" with menses. Using nurvaring, "loves it". Keep in place for 3 weeks, removes for 1 wk to allow for menses. LMP 04/27/20. Denies pelvic pain or symptoms of anemia. Requesting OV to further discuss with Dr. Edward Jolly.   OV scheduled for 7/14 at 4:30pm with Dr. Edward Jolly. Patient is aware to return call to office if any new symptoms develop or any concerns.   Last AEX 09/11/19  Routing to provider for final review. Patient is agreeable to disposition. Will close encounter.

## 2020-05-04 ENCOUNTER — Telehealth (INDEPENDENT_AMBULATORY_CARE_PROVIDER_SITE_OTHER): Payer: BC Managed Care – PPO | Admitting: Psychiatry

## 2020-05-04 ENCOUNTER — Other Ambulatory Visit: Payer: Self-pay

## 2020-05-04 DIAGNOSIS — F411 Generalized anxiety disorder: Secondary | ICD-10-CM | POA: Diagnosis not present

## 2020-05-04 MED ORDER — FLUOXETINE HCL 20 MG PO CAPS: 60.0000 mg | ORAL_CAPSULE | Freq: Every day | ORAL | 2 refills | Status: DC

## 2020-05-04 NOTE — Progress Notes (Signed)
BH MD/PA/NP OP Progress Note  05/04/2020 1:36 PM Elaine Robertson  MRN:  [id number] Interview was conducted by phone and I verified that I was speaking with the correct person using two identifiers. I discussed the limitations of evaluation and management by telemedicine and  the availability of in person appointments. Patient expressed understanding and agreed to proceed. Patient location - home; physician - home office.  Chief Complaint: None.  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 Patient reports having hx of treatment for generalized anxiety and depression between age of 24 and 24. She was prescribed escitalopram (up to 30 mg final dose) which worked well initially but after about two years effectiveness dropped off despite dose increase. She was also in counseling at that time. Her insurance changed and her psychiatrist was no longer in network so she stopped seeing him. She was doing well untill COVID started. Over past year she has been more anxious, worrying, ruminating about health, safety. She would fall asleep easily but she would wake up after few hours and go checking doors, windows. In the past when she felt stress leave being higher she would start to pull her hair. This would happen on and offbut has been more intense recently as she reports being under increased stress due to studying for entry exam to law school.We have added fluoxetine 20 mg and trazodone 50 mg at HS. Sleep improved but anxiety did not so we increased dose of fluoxetine to 40 mg and then to 60 mg. Anxiety/obsessions resolved fully. Unlike escitalopram, fluoxetine does not cause her to feel sedated.   Visit Diagnosis:    ICD-10-CM   1. GAD (generalized anxiety disorder)  F41.1     Past Psychiatric History: Please see intake H&P.  Past Medical History:  Past Medical History:  Diagnosis Date  . Amenorrhea   .  Bilateral ovarian cysts   . Depression   . Dysmenorrhea   . Generalized anxiety disorder   . PID (pelvic inflammatory disease) 03/2018   STD testing neg   No past surgical history on file.  Family Psychiatric History: Reviewed.  Family History:  Family History  Problem Relation Age of Onset  . Breast cancer Mother 70       she well  . Diabetes Father        type 1  . Alcohol abuse Father   . Bipolar disorder Father   . Hypertension Maternal Grandfather   . Ovarian cancer Paternal Grandmother 66       mets and thought began in ovary  . Cancer Paternal Grandfather 59       pancreatic    Social History:  Social History   Socioeconomic History  . Marital status: Single    Spouse name: Not on file  . Number of children: 0  . Years of education: Not on file  . Highest education level: Not on file  Occupational History  . Occupation: Ship broker  Tobacco Use  . Smoking status: Never Smoker  . Smokeless tobacco: Never Used  Vaping Use  . Vaping Use: Never used  Substance and Sexual Activity  . Alcohol use: Yes    Comment: occ. glass of wine  . Drug use: Never  . Sexual activity: Yes    Birth control/protection: Condom, Inserts    Comment: [name]  Other Topics Concern  . Not on file  Social History Narrative   Lives in a  house off campus with 3 roommates.   Social Determinants of Health   Financial Resource Strain:   . Difficulty of Paying Living Expenses:   Food Insecurity:   . Worried About Programme researcher, broadcasting/film/video in the Last Year:   . Barista in the Last Year:   Transportation Needs:   . Freight forwarder (Medical):   Marland Kitchen Lack of Transportation (Non-Medical):   Physical Activity:   . Days of Exercise per Week:   . Minutes of Exercise per Session:   Stress:   . Feeling of Stress :   Social Connections:   . Frequency of Communication with Friends and Family:   . Frequency of Social Gatherings with Friends and Family:   . Attends Religious Services:    . Active Member of Clubs or Organizations:   . Attends Banker Meetings:   Marland Kitchen Marital Status:     Allergies: No Known Allergies  Metabolic Disorder Labs: No results found for: HGBA1C, MPG No results found for: PROLACTIN No results found for: CHOL, TRIG, HDL, CHOLHDL, VLDL, LDLCALC No results found for: TSH  Therapeutic Level Labs: No results found for: LITHIUM No results found for: VALPROATE No components found for:  CBMZ  Current Medications: Current Outpatient Medications  Medication Sig Dispense Refill  . etonogestrel-ethinyl estradiol (NUVARING) 0.12-0.015 MG/24HR vaginal ring Place 1 each vaginally every 28 (twenty-eight) days. Insert vaginally and leave in place for 3 consecutive weeks, then remove for 1 week. 3 each 3  . [START ON 05/25/2020] FLUoxetine (PROZAC) 20 MG capsule Take 3 capsules (60 mg total) by mouth daily. 90 capsule 2  . traZODone (DESYREL) 50 MG tablet Take 1 tablet (50 mg total) by mouth at bedtime as needed for sleep. 30 tablet 2   No current facility-administered medications for this visit.      Psychiatric Specialty Exam: Review of Systems  All other systems reviewed and are negative.   There were no vitals taken for this visit.There is no height or weight on file to calculate BMI.  General Appearance: NA  Eye Contact:  NA  Speech:  Clear and Coherent and Normal Rate  Volume:  Normal  Mood:  Euthymic  Affect:  NA  Thought Process:  Goal Directed and Linear  Orientation:  Full (Time, Place, and Person)  Thought Content: Logical   Suicidal Thoughts:  No  Homicidal Thoughts:  No  Memory:  Immediate;   Good Recent;   Good Remote;   Good  Judgement:  Good  Insight:  Good  Psychomotor Activity:  NA  Concentration:  Concentration: Good  Recall:  Good  Fund of Knowledge: Good  Language: Good  Akathisia:  Negative  Handed:  Right  AIMS (if indicated): not done  Assets:  Communication Skills Desire for Improvement Financial  Resources/Insurance Housing Physical Health Talents/Skills Vocational/Educational  ADL's:  Intact  Cognition: WNL  Sleep:  Good   Assessment and Plan: 24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 She was prescribed escitalopram (up to 30 mg final dose) which worked well initially but after about two years effectiveness dropped off despite dose increase. She was also in counseling at that time. Her insurance changed and her psychiatrist was no longer in network so she stopped seeing him. She was doing well untill COVID started. Over past year she has been more anxious, worrying, ruminating about health, safety. She would fall  asleep easily but she would wake up after few hours and go checking doors, windows. In the past when she felt stress leave being higher she would start to pull her hair. This would happen on and offbut has been more intense recently as she reports being under increased stress due to studying for entry exam to law school.We have added fluoxetine 20 mg and trazodone 50 mg at HS. Sleep improved but anxiety did not so we increased dose of fluoxetine to 40 mg and then to 60 mg. Anxiety/obsessions resolved fully. Unlike escitalopram, fluoxetine does not cause her to feel sedated.   Dx: GAD (suspicion of Obsessive-compulsive disorder); Trichotillomania by hx.  Plan:We continuefluoxetine60 mgdaily and trazodone 50 mg at HS prn middle insomnia (uses it infrequently now). Next visit with me inthree months.The plan was discussed with patient who had an opportunity to ask questions and these were all answered. I spend51minutes inphone consultationwith the patient.    Magdalene Patricia, MD 05/04/2020, 1:36 PM

## 2020-05-25 NOTE — Progress Notes (Deleted)
GYNECOLOGY  VISIT   HPI: 24 y.o.   Single  Caucasian  female   [id number] with No LMP recorded.   here for heavy painful menses for the past 2-3 months.    GYNECOLOGIC HISTORY: No LMP recorded. Contraception:  Nuvaring Menopausal hormone therapy:  n/a Last mammogram:  n/a Last pap smear: 09-11-19 Neg        OB History    Gravida  0   Para  0   Term  0   Preterm  0   AB  0   Living  0     SAB  0   TAB  0   Ectopic  0   Multiple  0   Live Births  0              Patient Active Problem List   Diagnosis Date Noted  . Trichotillomania 12/29/2019  . GAD (generalized anxiety disorder) 11/28/2019  . Pelvic pain 09/09/2018  . Dysmenorrhea 09/09/2018  . Dyspareunia, female 09/09/2018    Past Medical History:  Diagnosis Date  . Amenorrhea   . Bilateral ovarian cysts   . Depression   . Dysmenorrhea   . Generalized anxiety disorder   . PID (pelvic inflammatory disease) 03/2018   STD testing neg    No past surgical history on file.  Current Outpatient Medications  Medication Sig Dispense Refill  . etonogestrel-ethinyl estradiol (NUVARING) 0.12-0.015 MG/24HR vaginal ring Place 1 each vaginally every 28 (twenty-eight) days. Insert vaginally and leave in place for 3 consecutive weeks, then remove for 1 week. 3 each 3  . FLUoxetine (PROZAC) 20 MG capsule Take 3 capsules (60 mg total) by mouth daily. 90 capsule 2  . traZODone (DESYREL) 50 MG tablet Take 1 tablet (50 mg total) by mouth at bedtime as needed for sleep. 30 tablet 2   No current facility-administered medications for this visit.     ALLERGIES: Patient has no known allergies.  Family History  Problem Relation Age of Onset  . Breast cancer Mother 49       she well  . Diabetes Father        type 1  . Alcohol abuse Father   . Bipolar disorder Father   . Hypertension Maternal Grandfather   . Ovarian cancer Paternal Grandmother 68       mets and thought began in ovary  . Cancer Paternal Grandfather  4       pancreatic    Social History   Socioeconomic History  . Marital status: Single    Spouse name: Not on file  . Number of children: 0  . Years of education: Not on file  . Highest education level: Not on file  Occupational History  . Occupation: Consulting civil engineer  Tobacco Use  . Smoking status: Never Smoker  . Smokeless tobacco: Never Used  Vaping Use  . Vaping Use: Never used  Substance and Sexual Activity  . Alcohol use: Yes    Comment: occ. glass of wine  . Drug use: Never  . Sexual activity: Yes    Birth control/protection: Condom, Inserts    Comment: [name]  Other Topics Concern  . Not on file  Social History Narrative   Lives in a house off campus with 3 roommates.   Social Determinants of Health   Financial Resource Strain:   . Difficulty of Paying Living Expenses:   Food Insecurity:   . Worried About Programme researcher, broadcasting/film/video in the Last Year:   . Ran  Out of Food in the Last Year:   Transportation Needs:   . Lack of Transportation (Medical):   Marland Kitchen Lack of Transportation (Non-Medical):   Physical Activity:   . Days of Exercise per Week:   . Minutes of Exercise per Session:   Stress:   . Feeling of Stress :   Social Connections:   . Frequency of Communication with Friends and Family:   . Frequency of Social Gatherings with Friends and Family:   . Attends Religious Services:   . Active Member of Clubs or Organizations:   . Attends Banker Meetings:   Marland Kitchen Marital Status:   Intimate Partner Violence:   . Fear of Current or Ex-Partner:   . Emotionally Abused:   Marland Kitchen Physically Abused:   . Sexually Abused:     Review of Systems  PHYSICAL EXAMINATION:    There were no vitals taken for this visit.    General appearance: alert, cooperative and appears stated age Head: Normocephalic, without obvious abnormality, atraumatic Neck: no adenopathy, supple, symmetrical, trachea midline and thyroid normal to inspection and palpation Lungs: clear to auscultation  bilaterally Breasts: normal appearance, no masses or tenderness, No nipple retraction or dimpling, No nipple discharge or bleeding, No axillary or supraclavicular adenopathy Heart: regular rate and rhythm Abdomen: soft, non-tender, no masses,  no organomegaly Extremities: extremities normal, atraumatic, no cyanosis or edema Skin: Skin color, texture, turgor normal. No rashes or lesions Lymph nodes: Cervical, supraclavicular, and axillary nodes normal. No abnormal inguinal nodes palpated Neurologic: Grossly normal  Pelvic: External genitalia:  no lesions              Urethra:  normal appearing urethra with no masses, tenderness or lesions              Bartholins and Skenes: normal                 Vagina: normal appearing vagina with normal color and discharge, no lesions              Cervix: no lesions                Bimanual Exam:  Uterus:  normal size, contour, position, consistency, mobility, non-tender              Adnexa: no mass, fullness, tenderness              Rectal exam: {yes no:[id number]}.  Confirms.              Anus:  normal sphincter tone, no lesions  Chaperone was present for exam.  ASSESSMENT     PLAN     An After Visit Summary was printed and given to the patient.  ______ minutes face to face time of which over 50% was spent in counseling.

## 2020-05-26 ENCOUNTER — Telehealth: Payer: Self-pay

## 2020-05-26 ENCOUNTER — Encounter: Payer: Self-pay | Admitting: Obstetrics and Gynecology

## 2020-05-26 ENCOUNTER — Ambulatory Visit: Payer: BC Managed Care – PPO | Admitting: Obstetrics and Gynecology

## 2020-05-26 NOTE — Telephone Encounter (Signed)
Patient [facility] office visit for today.

## 2020-05-27 NOTE — Telephone Encounter (Signed)
Thank you for the update!

## 2020-08-05 ENCOUNTER — Other Ambulatory Visit: Payer: Self-pay

## 2020-08-05 ENCOUNTER — Telehealth (INDEPENDENT_AMBULATORY_CARE_PROVIDER_SITE_OTHER): Payer: BC Managed Care – PPO | Admitting: Psychiatry

## 2020-08-05 DIAGNOSIS — F422 Mixed obsessional thoughts and acts: Secondary | ICD-10-CM

## 2020-08-05 DIAGNOSIS — F411 Generalized anxiety disorder: Secondary | ICD-10-CM

## 2020-08-05 MED ORDER — FLUOXETINE HCL 20 MG PO CAPS: 60.0000 mg | ORAL_CAPSULE | Freq: Every day | ORAL | 5 refills | Status: DC

## 2020-08-05 NOTE — Progress Notes (Signed)
BH MD/PA/NP OP Progress Note  08/05/2020 10:07 AM Retia Cordle  MRN:  [id number] Interview was conducted by phone and I verified that I was speaking with the correct person using two identifiers. I discussed the limitations of evaluation and management by telemedicine and  the availability of in person appointments. Patient expressed understanding and agreed to proceed. Patient location - home; physician - home office.  Chief Complaint: None.  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 She was prescribed escitalopram (up to 30 mg final dose) which worked well initially but after about two years effectiveness dropped off despite dose increase. She was also in counseling at that time. Her insurance changed and her psychiatrist was no longer in network so she stopped seeing him. She was doing well untill COVID started. Over past year she has been more anxious, worrying, ruminating about health, safety. She would fall asleep easily but she would wake up after few hours and go checking doors, windows. In the past when she felt stress leave being higher she would start to pull her hair. This would happen on and offbut has been more intense recently as she reports being under increased stress due to studying for entry exam to law school in October.We have added fluoxetine 20 mg and trazodone 50 mg at HS. Sleep improved but anxiety did notso we increased dose of fluoxetine to 40 mg and then to 60 mg. Anxiety/obsessions then resolved fully.Unlike escitalopram, fluoxetine does not cause her to feel sedated. She is no longer needing to take trazodone.   Visit Diagnosis:    ICD-10-CM   1. GAD (generalized anxiety disorder)  F41.1   2. Mixed obsessional thoughts and acts  F42.2     Past Psychiatric History: Please see intake H&P.  Past Medical History:  Past Medical History:  Diagnosis Date  .  Amenorrhea   . Bilateral ovarian cysts   . Depression   . Dysmenorrhea   . Generalized anxiety disorder   . PID (pelvic inflammatory disease) 03/2018   STD testing neg   No past surgical history on file.  Family Psychiatric History: Reviewed.  Family History:  Family History  Problem Relation Age of Onset  . Breast cancer Mother 65       she well  . Diabetes Father        type 1  . Alcohol abuse Father   . Bipolar disorder Father   . Hypertension Maternal Grandfather   . Ovarian cancer Paternal Grandmother 68       mets and thought began in ovary  . Cancer Paternal Grandfather 27       pancreatic    Social History:  Social History   Socioeconomic History  . Marital status: Single    Spouse name: Not on file  . Number of children: 0  . Years of education: Not on file  . Highest education level: Not on file  Occupational History  . Occupation: Consulting civil engineer  Tobacco Use  . Smoking status: Never Smoker  . Smokeless tobacco: Never Used  Vaping Use  . Vaping Use: Never used  Substance and Sexual Activity  . Alcohol use: Yes    Comment: occ. glass of wine  . Drug use: Never  . Sexual activity: Yes    Birth control/protection: Condom, Inserts    Comment: [name]  Other Topics Concern  . Not on file  Social History Narrative  Lives in a house off campus with 3 roommates.   Social Determinants of Health   Financial Resource Strain:   . Difficulty of Paying Living Expenses: Not on file  Food Insecurity:   . Worried About Programme researcher, broadcasting/film/video in the Last Year: Not on file  . Ran Out of Food in the Last Year: Not on file  Transportation Needs:   . Lack of Transportation (Medical): Not on file  . Lack of Transportation (Non-Medical): Not on file  Physical Activity:   . Days of Exercise per Week: Not on file  . Minutes of Exercise per Session: Not on file  Stress:   . Feeling of Stress : Not on file  Social Connections:   . Frequency of Communication with Friends  and Family: Not on file  . Frequency of Social Gatherings with Friends and Family: Not on file  . Attends Religious Services: Not on file  . Active Member of Clubs or Organizations: Not on file  . Attends Banker Meetings: Not on file  . Marital Status: Not on file    Allergies: No Known Allergies  Metabolic Disorder Labs: No results found for: HGBA1C, MPG No results found for: PROLACTIN No results found for: CHOL, TRIG, HDL, CHOLHDL, VLDL, LDLCALC No results found for: TSH  Therapeutic Level Labs: No results found for: LITHIUM No results found for: VALPROATE No components found for:  CBMZ  Current Medications: Current Outpatient Medications  Medication Sig Dispense Refill  . etonogestrel-ethinyl estradiol (NUVARING) 0.12-0.015 MG/24HR vaginal ring Place 1 each vaginally every 28 (twenty-eight) days. Insert vaginally and leave in place for 3 consecutive weeks, then remove for 1 week. 3 each 3  . FLUoxetine (PROZAC) 20 MG capsule Take 3 capsules (60 mg total) by mouth daily. 90 capsule 5   No current facility-administered medications for this visit.       Psychiatric Specialty Exam: Review of Systems  All other systems reviewed and are negative.   There were no vitals taken for this visit.There is no height or weight on file to calculate BMI.  General Appearance: NA  Eye Contact:  NA  Speech:  Clear and Coherent and Normal Rate  Volume:  Normal  Mood:  Euthymic  Affect:  NA  Thought Process:  Goal Directed and Linear  Orientation:  Full (Time, Place, and Person)  Thought Content: Logical   Suicidal Thoughts:  No  Homicidal Thoughts:  No  Memory:  Immediate;   Good Recent;   Good Remote;   Good  Judgement:  Good  Insight:  Good  Psychomotor Activity:  NA  Concentration:  Concentration: Good and Attention Span: Good  Recall:  Good  Fund of Knowledge: Good  Language: Good  Akathisia:  Negative  Handed:  Right  AIMS (if indicated): not done   Assets:  Communication Skills Desire for Improvement Financial Resources/Insurance Housing Leisure Time Physical Health Social Support Vocational/Educational  ADL's:  Intact  Cognition: WNL  Sleep:  Good     Assessment and Plan: 23 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 She was prescribed escitalopram (up to 30 mg final dose) which worked well initially but after about two years effectiveness dropped off despite dose increase. She was also in counseling at that time. Her insurance changed and her psychiatrist was no longer in network so she stopped seeing him. She was doing well untill COVID started. Over past  year she has been more anxious, worrying, ruminating about health, safety. She would fall asleep easily but she would wake up after few hours and go checking doors, windows. In the past when she felt stress leave being higher she would start to pull her hair. This would happen on and offbut has been more intense recently as she reports being under increased stress due to studying for entry exam to law school in October.We have added fluoxetine 20 mg and trazodone 50 mg at HS. Sleep improved but anxiety did notso we increased dose of fluoxetine to 40 mg and then to 60 mg. Anxiety/obsessions then resolved fully.Unlike escitalopram, fluoxetine does not cause her to feel sedated. She is no longer needing to take trazodone.  Dx: GAD/OCD; Trichotillomaniaby hx.  Plan:We continuefluoxetine60 mgdaily.  Next visit with me infour months.The plan was discussed with patient who had an opportunity to ask questions and these were all answered. I spend23minutes inphone consultationwith the patient.   Magdalene Patricia, MD 08/05/2020, 10:07 AM

## 2020-08-16 ENCOUNTER — Other Ambulatory Visit: Payer: Self-pay

## 2020-08-16 NOTE — Telephone Encounter (Signed)
Medication refill request: Nuvaring Last AEX:  09/11/19 Dr. Edward Jolly Next AEX: 09/15/20 Last MMG (if hormonal medication request): n/a Refill authorized: Today, please advise

## 2020-08-16 NOTE — Telephone Encounter (Signed)
Patient requesting refill on birth control. [facility] [phone].

## 2020-08-17 MED ORDER — ETONOGESTREL-ETHINYL ESTRADIOL 0.12-0.015 MG/24HR VA RING: 1.0000 | VAGINAL_RING | VAGINAL | 0 refills | Status: DC

## 2020-09-14 NOTE — Progress Notes (Signed)
24 y.o. G0P0000 Single Caucasian female here for annual exam.    Likes the NuvaRing.  Cramping is manageable.   Patient did not do genetic testing but she did counseling.   Prozac is working well.   Wants to go law school.  Will take a gap year.   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  Received her flu vaccine.   PCP: Talmage Nap, PA    Patient's last menstrual period was 09/12/2020.     Period Cycle (Days): 30 Period Duration (Days): 7 days Period Pattern: Regular Menstrual Flow: Moderate Menstrual Control:  (menstrual cup) Menstrual Control Change Freq (Hours): empties cup every 6-8 hours Dysmenorrhea: (!) Moderate Dysmenorrhea Symptoms: Cramping, Nausea, Diarrhea, Headache     Sexually active: Yes.    The current method of family planning is NuvaRing vaginal inserts.    Exercising: Yes.    walks 3 miles most days Smoker:  no  Health Maintenance: Pap: 09-11-19 Neg History of abnormal Pap:  no MMG:  n/a Colonoscopy:  n/a BMD:   n/a  Result  n/a TDaP:  2018 Gardasil:   yes HIV:09-11-19 NR Hep C:09-11-19 Neg Screening Labs:  Today.    reports that she has never smoked. She has never used smokeless tobacco. She reports current alcohol use of about 3.0 - 4.0 standard drinks of alcohol per week. She reports that she does not use drugs.  Past Medical History:  Diagnosis Date  . Amenorrhea   . Bilateral ovarian cysts   . Depression   . Dysmenorrhea   . Generalized anxiety disorder   . PID (pelvic inflammatory disease) 03/2018   STD testing neg    History reviewed. No pertinent surgical history.  Current Outpatient Medications  Medication Sig Dispense Refill  . etonogestrel-ethinyl estradiol (NUVARING) 0.12-0.015 MG/24HR vaginal ring Place 1 each vaginally every 28 (twenty-eight) days. Insert vaginally and leave in place for 3 consecutive weeks, then remove for 1 week. 1 each 0  . FLUoxetine (PROZAC) 20 MG capsule Take 3 capsules (60 mg total) by  mouth daily. 90 capsule 5   No current facility-administered medications for this visit.    Family History  Problem Relation Age of Onset  . Breast cancer Mother 28       she well  . Diabetes Father        type 1  . Alcohol abuse Father   . Bipolar disorder Father   . Hypertension Maternal Grandfather   . Ovarian cancer Paternal Grandmother 68       mets and thought began in ovary  . Cancer Paternal Grandfather 79       pancreatic    Review of Systems  All other systems reviewed and are negative.   Exam:   BP 100/62   Pulse 94   Ht 5' 4.5" (1.638 m)   Wt 136 lb (61.7 kg)   LMP 09/12/2020   SpO2 99%   BMI 22.98 kg/m     General appearance: alert, cooperative and appears stated age Head: normocephalic, without obvious abnormality, atraumatic Neck: no adenopathy, supple, symmetrical, trachea midline and thyroid normal to inspection and palpation Lungs: clear to auscultation bilaterally Breasts: normal appearance, no masses or tenderness, No nipple retraction or dimpling, No nipple discharge or bleeding, No axillary adenopathy Heart: regular rate and rhythm Abdomen: soft, non-tender; no masses, no organomegaly Extremities: extremities normal, atraumatic, no cyanosis or edema Skin: skin color, texture, turgor normal. No rashes or lesions Lymph nodes: cervical, supraclavicular, and axillary nodes  normal. Neurologic: grossly normal  Pelvic: External genitalia:  no lesions              No abnormal inguinal nodes palpated.              Urethra:  normal appearing urethra with no masses, tenderness or lesions              Bartholins and Skenes: normal                 Vagina: normal appearing vagina with normal color and discharge, no lesions              Cervix: no lesions              Pap taken: No. Bimanual Exam:  Uterus:  normal size, contour, position, consistency, mobility, non-tender              Adnexa: no mass, fullness, tenderness          Chaperone was present  for exam.  Assessment:   Well woman visit with normal exam. FH breast cancer. Mother in early 83s. Unknown genetic test results.  FH ovarian cancer. Paternal GM - presumed metastatic ovarian cancer.   Plan: Mammogram screening age 89.  Self breast awareness reviewed. Pap and HR HPV as above. Guidelines for Calcium, Vitamin D, regular exercise program including cardiovascular and weight bearing exercise. Refill of NuvaRing for one year. Std screening.  Routine labs.  Referral to genetics counselor for counseling/testing. Follow up annually and prn.

## 2020-09-15 ENCOUNTER — Other Ambulatory Visit (HOSPITAL_COMMUNITY)
Admission: RE | Admit: 2020-09-15 | Discharge: 2020-09-15 | Disposition: A | Discharge status: Home / Self Care | Payer: BC Managed Care – PPO | Source: Ambulatory Visit | Attending: Obstetrics and Gynecology | Admitting: Obstetrics and Gynecology

## 2020-09-15 ENCOUNTER — Other Ambulatory Visit: Payer: Self-pay

## 2020-09-15 ENCOUNTER — Encounter: Payer: Self-pay | Admitting: Obstetrics and Gynecology

## 2020-09-15 ENCOUNTER — Ambulatory Visit (INDEPENDENT_AMBULATORY_CARE_PROVIDER_SITE_OTHER): Payer: BC Managed Care – PPO | Admitting: Obstetrics and Gynecology

## 2020-09-15 VITALS — BP 100/62 | HR 94 | Ht 64.5 in | Wt 136.0 lb

## 2020-09-15 DIAGNOSIS — Z113 Encounter for screening for infections with a predominantly sexual mode of transmission: Secondary | ICD-10-CM | POA: Insufficient documentation

## 2020-09-15 DIAGNOSIS — Z803 Family history of malignant neoplasm of breast: Secondary | ICD-10-CM | POA: Diagnosis not present

## 2020-09-15 DIAGNOSIS — Z01419 Encounter for gynecological examination (general) (routine) without abnormal findings: Secondary | ICD-10-CM | POA: Diagnosis not present

## 2020-09-15 MED ORDER — ETONOGESTREL-ETHINYL ESTRADIOL 0.12-0.015 MG/24HR VA RING: 1.0000 | VAGINAL_RING | VAGINAL | 11 refills | Status: DC

## 2020-09-15 NOTE — Patient Instructions (Signed)

## 2020-09-16 LAB — HEP, RPR, HIV PANEL
HIV Screen 4th Generation wRfx: NONREACTIVE
Hepatitis B Surface Ag: NEGATIVE
RPR Ser Ql: NONREACTIVE

## 2020-09-16 LAB — COMPREHENSIVE METABOLIC PANEL
ALT: 8 IU/L (ref 0–32)
AST: 13 IU/L (ref 0–40)
Albumin/Globulin Ratio: 1.6 (ref 1.2–2.2)
Albumin: 4.5 g/dL (ref 3.9–5.0)
Alkaline Phosphatase: 70 IU/L (ref 44–121)
BUN/Creatinine Ratio: 14 (ref 9–23)
BUN: 8 mg/dL (ref 6–20)
Bilirubin Total: 0.4 mg/dL (ref 0.0–1.2)
CO2: 24 mmol/L (ref 20–29)
Calcium: 9.3 mg/dL (ref 8.7–10.2)
Chloride: 102 mmol/L (ref 96–106)
Creatinine, Ser: 0.57 mg/dL (ref 0.57–1.00)
GFR calc Af Amer: 152 mL/min/{1.73_m2} (ref >59)
GFR calc non Af Amer: 132 mL/min/{1.73_m2} (ref >59)
Globulin, Total: 2.8 g/dL (ref 1.5–4.5)
Glucose: 81 mg/dL (ref 65–99)
Potassium: 4.3 mmol/L (ref 3.5–5.2)
Sodium: 140 mmol/L (ref 134–144)
Total Protein: 7.3 g/dL (ref 6.0–8.5)

## 2020-09-16 LAB — HEPATITIS C ANTIBODY: Hep C Virus Ab: <0.1 s/co ratio (ref 0.0–0.9)

## 2020-09-16 LAB — LIPID PANEL
Chol/HDL Ratio: 2.2 ratio (ref 0.0–4.4)
Cholesterol, Total: 148 mg/dL (ref 100–199)
HDL: 68 mg/dL (ref >39)
LDL Chol Calc (NIH): 63 mg/dL (ref 0–99)
Triglycerides: 95 mg/dL (ref 0–149)
VLDL Cholesterol Cal: 17 mg/dL (ref 5–40)

## 2020-09-16 LAB — CBC
Hematocrit: 40.1 % (ref 34.0–46.6)
Hemoglobin: 13.3 g/dL (ref 11.1–15.9)
MCH: 31.1 pg (ref 26.6–33.0)
MCHC: 33.2 g/dL (ref 31.5–35.7)
MCV: 94 fL (ref 79–97)
Platelets: 219 10*3/uL (ref 150–450)
RBC: 4.27 x10E6/uL (ref 3.77–5.28)
RDW: 12.1 % (ref 11.7–15.4)
WBC: 4.7 10*3/uL (ref 3.4–10.8)

## 2020-09-16 LAB — CERVICOVAGINAL ANCILLARY ONLY
Chlamydia: NEGATIVE
Comment: NEGATIVE
Comment: NEGATIVE
Comment: NORMAL
Neisseria Gonorrhea: NEGATIVE
Trichomonas: NEGATIVE

## 2020-12-06 ENCOUNTER — Telehealth (INDEPENDENT_AMBULATORY_CARE_PROVIDER_SITE_OTHER): Payer: BC Managed Care – PPO | Admitting: Psychiatry

## 2020-12-06 ENCOUNTER — Other Ambulatory Visit: Payer: Self-pay

## 2020-12-06 DIAGNOSIS — F422 Mixed obsessional thoughts and acts: Secondary | ICD-10-CM | POA: Diagnosis not present

## 2020-12-06 DIAGNOSIS — F411 Generalized anxiety disorder: Secondary | ICD-10-CM

## 2020-12-06 MED ORDER — FLUOXETINE HCL 20 MG PO CAPS: 60.0000 mg | ORAL_CAPSULE | Freq: Every day | ORAL | 5 refills | Status: DC

## 2020-12-06 NOTE — Progress Notes (Signed)
BH MD/PA/NP OP Progress Note  12/06/2020 10:06 AM Brigitt Mcclish  MRN:  [id number] Interview was conducted using videoconferencing application and I verified that I was speaking with the correct person using two identifiers. I discussed the limitations of evaluation and management by telemedicine and  the availability of in person appointments. Patient expressed understanding and agreed to proceed. Participants in the visit: patient (location - home); physician (location - home office).  Chief Complaint: None.  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 She was prescribed escitalopram (up to 30 mg final dose) which worked well initially but after about two years effectiveness dropped off despite dose increase. She was also in counseling at that time. Her insurance changed and her psychiatrist was no longer in network so she stopped seeing him. She was doing well untill COVID started. Over past year she has been more anxious, worrying, ruminating about health, safety. She would fall asleep easily but she would wake up after few hours and go checking doors, windows. In the past when she felt stress leave being higher she would start to pull her hair. We have added fluoxetine 20 mg and trazodone 50 mg at HS. Sleep improved but anxiety did notso we increased dose of fluoxetine to 40 mgand then to 60 mg.Anxiety/obsessions then resolved fully.Unlike escitalopram, fluoxetine does not cause her to feel sedated. She is no longer needing to take trazodone. Serene will graduate this May and plans to move to [place] area [place] Ultimate goal is to get to [employer] at the [employer].    Visit Diagnosis:    ICD-10-CM   1. GAD (generalized anxiety disorder)  F41.1   2. Mixed obsessional thoughts and acts  F42.2     Past Psychiatric History: Please see intake H&P.  Past Medical History:   Past Medical History:  Diagnosis Date  . Amenorrhea   . Bilateral ovarian cysts   . Depression   . Dysmenorrhea   . Generalized anxiety disorder   . PID (pelvic inflammatory disease) 03/2018   STD testing neg   No past surgical history on file.  Family Psychiatric History: Reviewed.  Family History:  Family History  Problem Relation Age of Onset  . Breast cancer Mother 56       she well  . Diabetes Father        type 1  . Alcohol abuse Father   . Bipolar disorder Father   . Hypertension Maternal Grandfather   . Ovarian cancer Paternal Grandmother 68       mets and thought began in ovary  . Cancer Paternal Grandfather 26       pancreatic    Social History:  Social History   Socioeconomic History  . Marital status: Single    Spouse name: Not on file  . Number of children: 0  . Years of education: Not on file  . Highest education level: Not on file  Occupational History  . Occupation: Consulting civil engineer  Tobacco Use  . Smoking status: Never Smoker  . Smokeless tobacco: Never Used  Vaping Use  . Vaping Use: Never used  Substance and Sexual Activity  . Alcohol use: Yes    Alcohol/week: 3.0 - 4.0 standard drinks    Types: 3 - 4 Cans of beer per week    Comment: occ. glass of wine  . Drug use: Never  . Sexual activity: Yes  Birth control/protection: Condom, Inserts    Comment: Nuvaring  Other Topics Concern  . Not on file  Social History Narrative   Lives in a house off campus with 3 roommates.   Social Determinants of Health   Financial Resource Strain: Not on file  Food Insecurity: Not on file  Transportation Needs: Not on file  Physical Activity: Not on file  Stress: Not on file  Social Connections: Not on file    Allergies: No Known Allergies  Metabolic Disorder Labs: No results found for: HGBA1C, MPG No results found for: PROLACTIN Lab Results  Component Value Date   CHOL 148 09/15/2020   TRIG 95 09/15/2020   HDL 68 09/15/2020   CHOLHDL 2.2  09/15/2020   LDLCALC 63 09/15/2020   No results found for: TSH  Therapeutic Level Labs: No results found for: LITHIUM No results found for: VALPROATE No components found for:  CBMZ  Current Medications: Current Outpatient Medications  Medication Sig Dispense Refill  . etonogestrel-ethinyl estradiol (NUVARING) 0.12-0.015 MG/24HR vaginal ring Place 1 each vaginally every 28 (twenty-eight) days. Insert vaginally and leave in place for 3 consecutive weeks, then remove for 1 week. 1 each 11  . [START ON 02/01/2021] FLUoxetine (PROZAC) 20 MG capsule Take 3 capsules (60 mg total) by mouth daily. 90 capsule 5   No current facility-administered medications for this visit.    Psychiatric Specialty Exam: Review of Systems  All other systems reviewed and are negative.   There were no vitals taken for this visit.There is no height or weight on file to calculate BMI.  General Appearance: NA  Eye Contact:  NA  Speech:  Clear and Coherent and Normal Rate  Volume:  Normal  Mood:  Euthymic  Affect:  NA  Thought Process:  Goal Directed and Linear  Orientation:  Full (Time, Place, and Person)  Thought Content: Logical   Suicidal Thoughts:  No  Homicidal Thoughts:  No  Memory:  Immediate;   Good Recent;   Good Remote;   Good  Judgement:  Good  Insight:  Good  Psychomotor Activity:  NA  Concentration:  Concentration: Good  Recall:  Good  Fund of Knowledge: Good  Language: Good  Akathisia:  Negative  Handed:  Right  AIMS (if indicated): not done  Assets:  Communication Skills Desire for Improvement Financial Resources/Insurance Housing Physical Health Social Support Vocational/Educational  ADL's:  Intact  Cognition: WNL  Sleep:  Good    Assessment and Plan: 25 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 She was prescribed escitalopram (up to 30 mg final dose) which worked well initially but  after about two years effectiveness dropped off despite dose increase. She was also in counseling at that time. Her insurance changed and her psychiatrist was no longer in network so she stopped seeing him. She was doing well untill COVID started. Over past year she has been more anxious, worrying, ruminating about health, safety. She would fall asleep easily but she would wake up after few hours and go checking doors, windows. In the past when she felt stress leave being higher she would start to pull her hair. We have added fluoxetine 20 mg and trazodone 50 mg at HS. Sleep improved but anxiety did notso we increased dose of fluoxetine to 40 mgand then to 60 mg.Anxiety/obsessions then resolved fully.Unlike escitalopram, fluoxetine does not cause her to feel sedated. She is no longer needing to  take trazodone. [place] will graduate this May and plans to move to [place] area [place] Ultimate goal is to get to [employer] at the [employer].  Dx: GAD/OCD; Trichotillomaniaby hx.  Plan:Wecontinuefluoxetine60 mgdaily.  Next visit with a new provider infourmonths.The plan was discussed with patient who had an opportunity to ask questions and these were all answered. I spend59minutes inphone consultationwith the patient.   Magdalene Patricia, MD 12/06/2020, 10:06 AM

## 2021-02-28 ENCOUNTER — Other Ambulatory Visit: Payer: Self-pay

## 2021-02-28 ENCOUNTER — Encounter (HOSPITAL_COMMUNITY): Payer: Self-pay | Admitting: Psychiatry

## 2021-02-28 ENCOUNTER — Telehealth (INDEPENDENT_AMBULATORY_CARE_PROVIDER_SITE_OTHER): Payer: BC Managed Care – PPO | Admitting: Psychiatry

## 2021-02-28 DIAGNOSIS — F411 Generalized anxiety disorder: Secondary | ICD-10-CM | POA: Diagnosis not present

## 2021-02-28 MED ORDER — FLUOXETINE HCL 20 MG PO CAPS: 60.0000 mg | ORAL_CAPSULE | Freq: Every day | ORAL | 1 refills | Status: DC

## 2021-02-28 NOTE — Progress Notes (Signed)
BH MD/PA/NP OP Progress Note  02/28/2021 9:59 AM Elaine Robertson  MRN:  [id number] Interview was conducted using videoconferencing application and I verified that I was speaking with the correct person using two identifiers. I discussed the limitations of evaluation and management by telemedicine and  the availability of in person appointments. Patient expressed understanding and agreed to proceed. Participants in the visit: patient (location - home); physician (location - home office).  Chief Complaint:  None.  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  She was previously seen by dr.pucilowska who is no longer with [facility]. She is currently doing well, she is traveling abroad to study [place]. She is excited about her upcoming trip. Some anxiety around graduating this year. She is sleeping well and eating well.  fluoxetine does not cause her to feel sedated. She is no longer needing to take trazodone. Elaine Robertson will graduate this May and plans to move to [place] Ultimate goal is to get to [employer] at the [employer].  Denies suicidal thoughts.      Visit Diagnosis:  No diagnosis found.  Past Psychiatric History: Please see intake H&P.  Past Medical History:  Past Medical History:  Diagnosis Date  . Amenorrhea   . Bilateral ovarian cysts   . Depression   . Dysmenorrhea   . Generalized anxiety disorder   . PID (pelvic inflammatory disease) 03/2018   STD testing neg   No past surgical history on file.  Family Psychiatric History: Reviewed.  Family History:  Family History  Problem Relation Age of Onset  . Breast cancer Mother 82       she well  . Diabetes Father        type 1  . Alcohol abuse Father   . Bipolar disorder Father   . Hypertension Maternal Grandfather   . Ovarian cancer Paternal Grandmother 68       mets and thought began in ovary  . Cancer Paternal  Grandfather 38       pancreatic    Social History:  Social History   Socioeconomic History  . Marital status: Single    Spouse name: Not on file  . Number of children: 0  . Years of education: Not on file  . Highest education level: Not on file  Occupational History  . Occupation: Consulting civil engineer  Tobacco Use  . Smoking status: Never Smoker  . Smokeless tobacco: Never Used  Vaping Use  . Vaping Use: Never used  Substance and Sexual Activity  . Alcohol use: Yes    Alcohol/week: 3.0 - 4.0 standard drinks    Types: 3 - 4 Cans of beer per week    Comment: occ. glass of wine  . Drug use: Never  . Sexual activity: Yes    Birth control/protection: Condom, Inserts    Comment: Nuvaring  Other Topics Concern  . Not on file  Social History Narrative   Lives in a house off campus with 3 roommates.   Social Determinants of Health   Financial Resource Strain: Not on file  Food Insecurity: Not on file  Transportation Needs: Not on file  Physical Activity: Not on file  Stress: Not on file  Social Connections: Not on file    Allergies: No Known Allergies  Metabolic Disorder Labs: No results found for: HGBA1C, MPG No results found for: PROLACTIN Lab Results  Component Value Date   CHOL 148 09/15/2020   TRIG  95 09/15/2020   HDL 68 09/15/2020   CHOLHDL 2.2 09/15/2020   LDLCALC 63 09/15/2020   No results found for: TSH  Therapeutic Level Labs: No results found for: LITHIUM No results found for: VALPROATE No components found for:  CBMZ  Current Medications: Current Outpatient Medications  Medication Sig Dispense Refill  . etonogestrel-ethinyl estradiol (NUVARING) 0.12-0.015 MG/24HR vaginal ring Place 1 each vaginally every 28 (twenty-eight) days. Insert vaginally and leave in place for 3 consecutive weeks, then remove for 1 week. 1 each 11  . FLUoxetine (PROZAC) 20 MG capsule Take 3 capsules (60 mg total) by mouth daily. 90 capsule 5   No current facility-administered  medications for this visit.    Psychiatric Specialty Exam: Review of Systems  All other systems reviewed and are negative.   There were no vitals taken for this visit.There is no height or weight on file to calculate BMI.  General Appearance: NA  Eye Contact:  NA  Speech:  Clear and Coherent and Normal Rate  Volume:  Normal  Mood:  Euthymic  Affect:  NA  Thought Process:  Goal Directed and Linear  Orientation:  Full (Time, Place, and Person)  Thought Content: Logical   Suicidal Thoughts:  No  Homicidal Thoughts:  No  Memory:  Immediate;   Good Recent;   Good Remote;   Good  Judgement:  Good  Insight:  Good  Psychomotor Activity:  NA  Concentration:  Concentration: Good  Recall:  Good  Fund of Knowledge: Good  Language: Good  Akathisia:  Negative  Handed:  Right  AIMS (if indicated): not done  Assets:  Communication Skills Desire for Improvement Financial Resources/Insurance Housing Physical Health Social Support Vocational/Educational  ADL's:  Intact  Cognition: WNL  Sleep:  Good    Assessment and Plan: 25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  Anxiety/obsessions then resolved fully. Unlike escitalopram, fluoxetine does not cause her to feel sedated. She is no longer needing to take trazodone. Antanette will graduate this May and plans to move to [place] area [place] Ultimate goal is to get to [employer] at the [employer].   Dx: GAD/OCD; Trichotillomania by hx.   Plan: We will continue fluoxetine 60 mg daily.  Next visit in July when patient comes back from her study abroad.  I spent 15 minutes in phone consultation with the patient. Patient also needs a form completed attesting to her mental health. I spent 20 minutes reviewing the chart since this was patient's first visit with me.   Patrick North, MD 02/28/2021, 9:59 AM

## 2021-03-04 ENCOUNTER — Encounter (HOSPITAL_COMMUNITY): Payer: Self-pay | Admitting: Psychiatry

## 2021-04-13 ENCOUNTER — Telehealth (HOSPITAL_COMMUNITY): Payer: Self-pay | Admitting: *Deleted

## 2021-04-13 NOTE — Telephone Encounter (Signed)
Placed call to patient to advise of cancelled appointment and need to find psychiatric services from another provider.  Patient informed about letter coming with resources.  Patient does not need refills.  Left message. 

## 2021-05-13 DIAGNOSIS — U071 COVID-19: Secondary | ICD-10-CM

## 2021-05-13 HISTORY — DX: COVID-19: U07.1

## 2021-05-30 ENCOUNTER — Telehealth (HOSPITAL_COMMUNITY): Payer: BC Managed Care – PPO | Admitting: Psychiatry

## 2021-07-12 NOTE — Progress Notes (Signed)
GYNECOLOGY  VISIT   HPI: 25 y.o.   Single  Caucasian  female   [id number] with Patient's last menstrual period was 07/05/2021.   here for right axillary mass. Patient states this appeared 2 weeks ago and has rapidly gone down in past 2 days.  Lump came up suddenly.  She was feeling well and had a negative Covid test.  She did have Covid in July, week of 21st.   No other nodes enlarged.   Her last Covid booster was beginning of 2022.   Planning on going to law school.   Patient did not do genetic counseling or testing yet.   GYNECOLOGIC HISTORY: Patient's last menstrual period was 07/05/2021. Contraception:  Nuvaring Menopausal hormone therapy:  n/a Last mammogram:  n/a Last pap smear: 09-11-19 Neg         OB History     Gravida  0   Para  0   Term  0   Preterm  0   AB  0   Living  0      SAB  0   IAB  0   Ectopic  0   Multiple  0   Live Births  0              Patient Active Problem List   Diagnosis Date Noted   Mixed obsessional thoughts and acts 08/05/2020   Trichotillomania 12/29/2019   GAD (generalized anxiety disorder) 11/28/2019   Pelvic pain 09/09/2018   Dysmenorrhea 09/09/2018   Dyspareunia, female 09/09/2018    Past Medical History:  Diagnosis Date   Amenorrhea    Bilateral ovarian cysts    COVID 05/2021   Depression    Dysmenorrhea    Generalized anxiety disorder    PID (pelvic inflammatory disease) 03/2018   STD testing neg    History reviewed. No pertinent surgical history.  Current Outpatient Medications  Medication Sig Dispense Refill   etonogestrel-ethinyl estradiol (NUVARING) 0.12-0.015 MG/24HR vaginal ring Place 1 each vaginally every 28 (twenty-eight) days. Insert vaginally and leave in place for 3 consecutive weeks, then remove for 1 week. 1 each 11   FLUoxetine (PROZAC) 20 MG capsule Take 3 capsules (60 mg total) by mouth daily. 270 capsule 1   No current facility-administered medications for this visit.      ALLERGIES: Patient has no known allergies.  Family History  Problem Relation Age of Onset   Breast cancer Mother 14       she well   Diabetes Father        type 1   Alcohol abuse Father    Bipolar disorder Father    Hypertension Maternal Grandfather    Ovarian cancer Paternal Grandmother 51       mets and thought began in ovary   Cancer Paternal Grandfather 69       pancreatic    Social History   Socioeconomic History   Marital status: Single    Spouse name: Not on file   Number of children: 0   Years of education: Not on file   Highest education level: Not on file  Occupational History   Occupation: student  Tobacco Use   Smoking status: Never   Smokeless tobacco: Never  Vaping Use   Vaping Use: Never used  Substance and Sexual Activity   Alcohol use: Yes    Alcohol/week: 3.0 - 4.0 standard drinks    Types: 3 - 4 Cans of beer per week    Comment: occ. glass of  wine   Drug use: Never   Sexual activity: Yes    Birth control/protection: Condom, Inserts    Comment: Nuvaring  Other Topics Concern   Not on file  Social History Narrative   Lives in a house off campus with 3 roommates.   Social Determinants of Health   Financial Resource Strain: Not on file  Food Insecurity: Not on file  Transportation Needs: Not on file  Physical Activity: Not on file  Stress: Not on file  Social Connections: Not on file  Intimate Partner Violence: Not on file    Review of Systems  Skin:        Right axillary mass.  All other systems reviewed and are negative.  PHYSICAL EXAMINATION:    BP 100/74   Pulse 79   Ht 5' 4.5" (1.638 m)   Wt 136 lb (61.7 kg)   LMP 07/05/2021   SpO2 99%   BMI 22.98 kg/m     General appearance: alert, cooperative and appears stated age Neck: no adenopathy, supple, symmetrical, trachea midline and thyroid normal to inspection and palpation Lungs: clear to auscultation bilaterally Breasts: normal appearance, no masses or tenderness, No  nipple retraction or dimpling, No nipple discharge or bleeding, right axillary node palpable pea size and no left axillary nodes palpable. Heart: regular rate and rhythm Abdomen: soft, non-tender, no masses,  no organomegaly Extremities: extremities normal, atraumatic, no cyanosis or edema Skin: Skin color, texture, turgor normal. No rashes or lesions Lymph nodes: Cervical, supraclavicular, and inguinal nodes normal.   Chaperone was present for exam:  Marchelle Folks, CMA.  ASSESSMENT  Right axillary adenopathy, likely due to Covid infection.  Resolving.  FH breast cancer in mother.   PLAN  Reassurance given regarding adenopathy.  Will refer for genetics counseling and testing.  Follow up for annual exam and prn.    An After Visit Summary was printed and given to the patient.  21 min  total time was spent for this patient encounter, including preparation, face-to-face counseling with the patient, coordination of care, and documentation of the encounter.

## 2021-07-13 ENCOUNTER — Other Ambulatory Visit: Payer: Self-pay

## 2021-07-13 ENCOUNTER — Encounter: Payer: Self-pay | Admitting: Obstetrics and Gynecology

## 2021-07-13 ENCOUNTER — Ambulatory Visit: Payer: BC Managed Care – PPO | Admitting: Obstetrics and Gynecology

## 2021-07-13 VITALS — BP 100/74 | HR 79 | Ht 64.5 in | Wt 136.0 lb

## 2021-07-13 DIAGNOSIS — Z803 Family history of malignant neoplasm of breast: Secondary | ICD-10-CM

## 2021-07-13 DIAGNOSIS — R590 Localized enlarged lymph nodes: Secondary | ICD-10-CM | POA: Diagnosis not present

## 2021-09-20 ENCOUNTER — Ambulatory Visit: Payer: Self-pay | Admitting: Obstetrics and Gynecology

## 2021-12-06 ENCOUNTER — Other Ambulatory Visit: Payer: Self-pay | Admitting: Obstetrics and Gynecology

## 2023-08-17 ENCOUNTER — Emergency Department (HOSPITAL_COMMUNITY): Payer: BC Managed Care – PPO

## 2023-08-17 ENCOUNTER — Other Ambulatory Visit: Payer: Self-pay

## 2023-08-17 ENCOUNTER — Emergency Department (HOSPITAL_COMMUNITY)
Admission: EM | Admit: 2023-08-17 | Discharge: 2023-08-17 | Disposition: A | Discharge status: Home / Self Care | Payer: BC Managed Care – PPO | Attending: Emergency Medicine | Admitting: Emergency Medicine

## 2023-08-17 ENCOUNTER — Encounter (HOSPITAL_COMMUNITY): Payer: Self-pay

## 2023-08-17 DIAGNOSIS — R1031 Right lower quadrant pain: Secondary | ICD-10-CM | POA: Insufficient documentation

## 2023-08-17 DIAGNOSIS — R112 Nausea with vomiting, unspecified: Secondary | ICD-10-CM | POA: Insufficient documentation

## 2023-08-17 DIAGNOSIS — R1030 Lower abdominal pain, unspecified: Secondary | ICD-10-CM | POA: Diagnosis present

## 2023-08-17 DIAGNOSIS — R195 Other fecal abnormalities: Secondary | ICD-10-CM | POA: Insufficient documentation

## 2023-08-17 DIAGNOSIS — R109 Unspecified abdominal pain: Secondary | ICD-10-CM

## 2023-08-17 LAB — URINALYSIS, ROUTINE W REFLEX MICROSCOPIC
Bacteria, UA: NONE SEEN
Bilirubin Urine: NEGATIVE
Glucose, UA: NEGATIVE mg/dL
Ketones, ur: NEGATIVE mg/dL
Leukocytes,Ua: NEGATIVE
Nitrite: NEGATIVE
Protein, ur: 30 mg/dL — AB
Specific Gravity, Urine: 1.02 (ref 1.005–1.030)
pH: 6 (ref 5.0–8.0)

## 2023-08-17 LAB — COMPREHENSIVE METABOLIC PANEL
ALT: 15 U/L (ref 0–44)
AST: 15 U/L (ref 15–41)
Albumin: 4.5 g/dL (ref 3.5–5.0)
Alkaline Phosphatase: 67 U/L (ref 38–126)
Anion gap: 7 (ref 5–15)
BUN: 13 mg/dL (ref 6–20)
CO2: 24 mmol/L (ref 22–32)
Calcium: 8.9 mg/dL (ref 8.9–10.3)
Chloride: 108 mmol/L (ref 98–111)
Creatinine, Ser: 0.56 mg/dL (ref 0.44–1.00)
GFR, Estimated: >60 mL/min (ref >60)
Glucose, Bld: 152 mg/dL — ABNORMAL HIGH (ref 70–99)
Potassium: 3.9 mmol/L (ref 3.5–5.1)
Sodium: 139 mmol/L (ref 135–145)
Total Bilirubin: 0.6 mg/dL (ref 0.3–1.2)
Total Protein: 7.8 g/dL (ref 6.5–8.1)

## 2023-08-17 LAB — WET PREP, GENITAL
Clue Cells Wet Prep HPF POC: NONE SEEN
Sperm: NONE SEEN
Trich, Wet Prep: NONE SEEN
WBC, Wet Prep HPF POC: <10 (ref <10)
Yeast Wet Prep HPF POC: NONE SEEN

## 2023-08-17 LAB — CBC
HCT: 38.3 % (ref 36.0–46.0)
Hemoglobin: 12.6 g/dL (ref 12.0–15.0)
MCH: 31.5 pg (ref 26.0–34.0)
MCHC: 32.9 g/dL (ref 30.0–36.0)
MCV: 95.8 fL (ref 80.0–100.0)
Platelets: 177 10*3/uL (ref 150–400)
RBC: 4 MIL/uL (ref 3.87–5.11)
RDW: 12.5 % (ref 11.5–15.5)
WBC: 11.1 10*3/uL — ABNORMAL HIGH (ref 4.0–10.5)
nRBC: 0 % (ref 0.0–0.2)

## 2023-08-17 LAB — LIPASE, BLOOD: Lipase: 27 U/L (ref 11–51)

## 2023-08-17 LAB — HCG, SERUM, QUALITATIVE: Preg, Serum: NEGATIVE

## 2023-08-17 MED ORDER — SODIUM CHLORIDE (PF) 0.9 % IJ SOLN
INTRAMUSCULAR | Status: AC
  Filled 2023-08-17: qty 50

## 2023-08-17 MED ORDER — MORPHINE SULFATE (PF) 4 MG/ML IV SOLN
4.0000 mg | Freq: Once | INTRAVENOUS | Status: AC
  Administered 2023-08-17: 4 mg via INTRAVENOUS
  Filled 2023-08-17: qty 1

## 2023-08-17 MED ORDER — ONDANSETRON HCL 4 MG PO TABS: 4.0000 mg | ORAL_TABLET | Freq: Four times a day (QID) | ORAL | 0 refills | Status: DC

## 2023-08-17 MED ORDER — ONDANSETRON HCL 4 MG/2ML IJ SOLN
4.0000 mg | Freq: Once | INTRAMUSCULAR | Status: AC
  Administered 2023-08-17: 4 mg via INTRAVENOUS
  Filled 2023-08-17: qty 2

## 2023-08-17 MED ORDER — IOHEXOL 300 MG/ML  SOLN
100.0000 mL | Freq: Once | INTRAMUSCULAR | Status: AC | PRN
  Administered 2023-08-17: 100 mL via INTRAVENOUS

## 2023-08-17 NOTE — ED Provider Notes (Signed)
[place] EMERGENCY DEPARTMENT AT [facility] Provider Note   CSN: [id number] Arrival date & time: 08/17/23  1238     History  Chief Complaint  Patient presents with   Abdominal Pain    Elaine Robertson is a 27 y.o. female.  The history is provided by the patient and medical records. No language interpreter was used.  Abdominal Pain    27 year old female  history of ovarian cysts, anxiety disorder presenting with complaint of abdominal pain.  Patient endorsed lower abdominal pain that started earlier this morning.  Described pain as a sharp sensation comes in waves with associated nausea, vomiting having some loose stool.  Currently rates the pain a 6 out of 10.  Denies any fever but does endorse some chills.  She is currently on her menstruation.  She denies any urinary symptoms and no vaginal discharge.  She mention she has history of ovarian cyst that ruptured in the past and initially thought that this pain may be related to that however she normally does not have nausea and vomiting associate with ruptured ovarian cyst.  No prior abdominal surgeries.  She did try taking some ibuprofen but was unable to keep it down.  She denies any recent travel or any exotic food.  Home Medications Prior to Admission medications   Medication Sig Start Date End Date Taking? Authorizing Provider  etonogestrel-ethinyl estradiol (NUVARING) 0.12-0.015 MG/24HR vaginal ring Place 1 each vaginally every 28 (twenty-eight) days. Insert vaginally and leave in place for 3 consecutive weeks, then remove for 1 week. 09/15/20   Patton Salles, MD  FLUoxetine (PROZAC) 20 MG capsule Take 3 capsules (60 mg total) by mouth daily. 02/28/21 08/27/21  Patrick North, MD      Allergies    Patient has no known allergies.    Review of Systems   Review of Systems  Gastrointestinal:  Positive for abdominal pain.  All other systems reviewed and are negative.   Physical Exam Updated Vital  Signs BP 107/64 (BP Location: Left Arm)   Pulse 87   Temp (!) 97.4 F (36.3 C) (Oral)   Resp 16   Ht 5\' 5"  (1.651 m)   Wt 59 kg   LMP 08/14/2023 (Exact Date)   SpO2 100%   BMI 21.63 kg/m  Physical Exam Vitals and nursing note reviewed.  Constitutional:      General: She is not in acute distress.    Appearance: She is well-developed.  HENT:     Head: Atraumatic.  Eyes:     Conjunctiva/sclera: Conjunctivae normal.  Cardiovascular:     Rate and Rhythm: Normal rate and regular rhythm.  Pulmonary:     Effort: Pulmonary effort is normal.  Abdominal:     Palpations: Abdomen is soft.     Tenderness: There is abdominal tenderness in the right lower quadrant. There is no guarding or rebound. Positive signs include McBurney's sign. Negative signs include Murphy's sign.  Musculoskeletal:     Cervical back: Neck supple.  Skin:    Findings: No rash.  Neurological:     Mental Status: She is alert.  Psychiatric:        Mood and Affect: Mood normal.     ED Results / Procedures / Treatments   Labs (all labs ordered are listed, but only abnormal results are displayed) Labs Reviewed  COMPREHENSIVE METABOLIC PANEL - Abnormal; Notable for the following components:      Result Value   Glucose, Bld 152 (*)  All other components within normal limits  CBC - Abnormal; Notable for the following components:   WBC 11.1 (*)    All other components within normal limits  URINALYSIS, ROUTINE W REFLEX MICROSCOPIC - Abnormal; Notable for the following components:   Hgb urine dipstick MODERATE (*)    Protein, ur 30 (*)    All other components within normal limits  WET PREP, GENITAL  LIPASE, BLOOD  HCG, SERUM, QUALITATIVE  GC/CHLAMYDIA PROBE AMP ([place]) NOT AT [facility]    EKG None  Radiology No results found.  Procedures Pelvic exam  Date/Time: 08/17/2023 1:53 PM  Performed by: Fayrene Helper, PA-C Authorized by: Fayrene Helper, PA-C  Comments: Chaperone present during exam.  No  inguinal lymphadenopathy or inguinal hernia noted.  Normal external genitalia.  No significant discomfort with speculum insertion.  Small amount of blood noted in vaginal vault.  Closed cervical os free of lesion or rash.  On bimanual exam no adnexal tenderness or cervical motion tenderness.       Medications Ordered in ED Medications  morphine (PF) 4 MG/ML injection 4 mg (4 mg Intravenous Given 08/17/23 1355)  ondansetron (ZOFRAN) injection 4 mg (4 mg Intravenous Given 08/17/23 1354)  iohexol (OMNIPAQUE) 300 MG/ML solution 100 mL (100 mLs Intravenous Contrast Given 08/17/23 1421)    ED Course/ Medical Decision Making/ A&P Clinical Course as of 08/17/23 1502  Fri Aug 17, 2023  1501 RLQ pain. Pelvic is normal. Rule out appy. CT pending. Reassess. Could consider pelvic u/s if still having pain. [JR]    Clinical Course User Index [JR] Gareth Eagle, PA-C                                 Medical Decision Making Amount and/or Complexity of Data Reviewed Labs: ordered. Radiology: ordered.  Risk Prescription drug management.   BP 107/64 (BP Location: Left Arm)   Pulse 87   Temp (!) 97.4 F (36.3 C) (Oral)   Resp 16   Ht 5\' 5"  (1.651 m)   Wt 59 kg   LMP 08/14/2023 (Exact Date)   SpO2 100%   BMI 21.63 kg/m   23:58 PM  27 year old female  history of ovarian cysts, anxiety disorder presenting with complaint of abdominal pain.  Patient endorsed lower abdominal pain that started earlier this morning.  Described pain as a sharp sensation comes in waves with associated nausea, vomiting having some loose stool.  Currently rates the pain a 6 out of 10.  Denies any fever but does endorse some chills.  She is currently on her menstruation.  She denies any urinary symptoms and no vaginal discharge.  She mention she has history of ovarian cyst that ruptured in the past and initially thought that this pain may be related to that however she normally does not have nausea and vomiting associate  with ruptured ovarian cyst.  No prior abdominal surgeries.  She did try taking some ibuprofen but was unable to keep it down.  She denies any recent travel or any exotic food.  On exam this is a well-appearing female resting comfortably appears to be in no acute discomfort.  Heart with normal rate and rhythm, lungs clear to auscultation bilaterally abdomen is soft with some tenderness to right lower quadrant no guarding no rebound tenderness.  Vital signs overall reassuring no fever no hypoxia.  -Labs ordered, independently viewed and interpreted by me.  Labs remarkable for UA showing  moderate Hgb, likely from vaginal bleeding.  WBC 11.1, preg test negative -The patient was maintained on a cardiac monitor.  I personally viewed and interpreted the cardiac monitored which showed an underlying rhythm of: NSR -Imaging including abd/pelvis CT pending if negative pt will need reassessment to determine if she would benefit from transvaginal US to r/o ovarian torsion. However, suspicion is low -This patient presents to the ED for concern of abd pain, this involves an extensive number of treatment options, and is a complaint that carries with it a high risk of complications and morbidity.  The differential diagnosis includes appendicitis, ruptured ovarian cyst, TOA, ovarian torsion, colitis, viral GI, UTI, kidney stone -Co morbidities that complicate the patient evaluation includes ovarian cyst -Treatment includes morphine, zofran -Reevaluation of the patient after these medicines showed that the patient improved -PCP office notes or outside notes reviewed -Discussion with oncoming provider who will f/u on CT result and will reassess -Escalation to admission/observation considered: dispo pending.          Final Clinical Impression(s) / ED Diagnoses Final diagnoses:  None    Rx / DC Orders ED Discharge Orders     None         Fayrene Helper, PA-C 08/17/23 1505    Arby Barrette,  MD 08/17/23 1644

## 2023-08-17 NOTE — ED Triage Notes (Signed)
Pt complaining of severe abd pain that began today and has become progressively worse. Pt endorses N/V/D. Hx of ovarian cyst.

## 2023-08-17 NOTE — ED Provider Notes (Signed)
Accepted handoff at shift change from [facility]. Please see prior provider note for more detail.   Briefly: Patient is 27 y.o. well-appearing female presenting for right lower quadrant pain that started early this morning.  Does have history of ruptured ovarian cyst.  DDX: concern for  appendicitis, ruptured ovarian cyst, TOA, ovarian torsion, colitis, viral GI, UTI, kidney stone   Plan: Reassess and follow-up on CT.  If reassuring, likely discharge.    Physical Exam  BP (!) 97/53   Pulse 84   Temp (!) 97.4 F (36.3 C) (Oral)   Resp 16   Ht 5\' 5"  (1.651 m)   Wt 59 kg   LMP 08/14/2023 (Exact Date)   SpO2 98%   BMI 21.63 kg/m   Physical Exam  Procedures  Procedures  ED Course / MDM   Clinical Course as of 08/17/23 1645  Fri Aug 17, 2023  1501 RLQ pain. Pelvic is normal. Rule out appy. CT pending. Reassess. Could consider pelvic u/s if still having pain. [JR]  1559 Nitrite: NEGATIVE [JR]    Clinical Course User Index [JR] Gareth Eagle, PA-C   Medical Decision Making Amount and/or Complexity of Data Reviewed Labs: ordered. Decision-making details documented in ED Course. Radiology: ordered.  Risk Prescription drug management.   On reassessment, patient stated that her abdominal pain was much improved.  CT scan was unremarkable.  Fluid challenge with no issue.  Patient stated that she was mildly nauseous.  Sent home with Zofran.  Discussed return precautions.  Advised to follow-up PCP.  Vital stable.  Discharged home in condition.       Gareth Eagle, PA-C 08/17/23 1650    Durwin Glaze, MD 08/17/23 [phone]

## 2023-08-17 NOTE — Discharge Instructions (Addendum)
Evaluation today was overall reassuring.  Recommend you follow-up your PCP for lower abdominal pain.  CT scan was negative for acute pathology.  If you are nauseous at home recommend you take Zofran as needed.  Would also advised continued hydration at home and advance your diet as tolerated.  If you develop worsening abdominal pain, fever, inability to tolerate fluid intake, blood in the stool vomit or urine or any other concerning symptom please return to the emergency department for further evaluation.

## 2023-08-20 LAB — GC/CHLAMYDIA PROBE AMP (~~LOC~~) NOT AT ARMC
Chlamydia: NEGATIVE
Comment: NEGATIVE
Comment: NORMAL
Neisseria Gonorrhea: NEGATIVE

## 2024-04-24 ENCOUNTER — Encounter (HOSPITAL_BASED_OUTPATIENT_CLINIC_OR_DEPARTMENT_OTHER): Payer: Self-pay | Admitting: Emergency Medicine

## 2024-04-24 ENCOUNTER — Other Ambulatory Visit: Payer: Self-pay

## 2024-04-24 ENCOUNTER — Emergency Department (HOSPITAL_BASED_OUTPATIENT_CLINIC_OR_DEPARTMENT_OTHER)
Admission: EM | Admit: 2024-04-24 | Discharge: 2024-04-25 | Disposition: A | Discharge status: Home / Self Care | Attending: Emergency Medicine | Admitting: Emergency Medicine

## 2024-04-24 DIAGNOSIS — H5704 Mydriasis: Secondary | ICD-10-CM | POA: Diagnosis not present

## 2024-04-24 DIAGNOSIS — H538 Other visual disturbances: Secondary | ICD-10-CM | POA: Diagnosis present

## 2024-04-24 LAB — CBC WITH DIFFERENTIAL/PLATELET
Abs Immature Granulocytes: 0.01 10*3/uL (ref 0.00–0.07)
Basophils Absolute: 0 10*3/uL (ref 0.0–0.1)
Basophils Relative: 1 %
Eosinophils Absolute: 0.1 10*3/uL (ref 0.0–0.5)
Eosinophils Relative: 2 %
HCT: 36.3 % (ref 36.0–46.0)
Hemoglobin: 12.2 g/dL (ref 12.0–15.0)
Immature Granulocytes: 0 %
Lymphocytes Relative: 31 %
Lymphs Abs: 1.9 10*3/uL (ref 0.7–4.0)
MCH: 31 pg (ref 26.0–34.0)
MCHC: 33.6 g/dL (ref 30.0–36.0)
MCV: 92.4 fL (ref 80.0–100.0)
Monocytes Absolute: 0.7 10*3/uL (ref 0.1–1.0)
Monocytes Relative: 11 %
Neutro Abs: 3.5 10*3/uL (ref 1.7–7.7)
Neutrophils Relative %: 55 %
Platelets: 184 10*3/uL (ref 150–400)
RBC: 3.93 MIL/uL (ref 3.87–5.11)
RDW: 12.5 % (ref 11.5–15.5)
WBC: 6.2 10*3/uL (ref 4.0–10.5)
nRBC: 0 % (ref 0.0–0.2)

## 2024-04-24 LAB — CBG MONITORING, ED: Glucose-Capillary: 96 mg/dL (ref 70–99)

## 2024-04-24 MED ORDER — TETRACAINE HCL 0.5 % OP SOLN
1.0000 [drp] | Freq: Once | OPHTHALMIC | Status: AC
  Administered 2024-04-24: 1 [drp] via OPHTHALMIC

## 2024-04-24 NOTE — ED Provider Notes (Signed)
 [place] EMERGENCY DEPARTMENT AT [facility] Provider Note   CSN: [id number] Arrival date & time: 04/24/24  2153     History Chief Complaint  Patient presents with   Eye Problem    HPI Elaine Robertson is a 28 y.o. female presenting for chief complaint of left eye dilation. States that she has some pressure but denies pain No other symptoms.  Intermittent Blurry vision.   Patient's recorded medical, surgical, social, medication list and allergies were reviewed in the Snapshot window as part of the initial history.   Review of Systems   Review of Systems  Constitutional:  Negative for chills and fever.  HENT:  Negative for ear pain and sore throat.   Eyes:  Positive for visual disturbance. Negative for pain.  Respiratory:  Negative for cough and shortness of breath.   Cardiovascular:  Negative for chest pain and palpitations.  Gastrointestinal:  Negative for abdominal pain and vomiting.  Genitourinary:  Negative for dysuria and hematuria.  Musculoskeletal:  Negative for arthralgias and back pain.  Skin:  Negative for color change and rash.  Neurological:  Negative for seizures and syncope.  All other systems reviewed and are negative.   Physical Exam Updated Vital Signs BP 104/60   Pulse 79   Temp 98.1 F (36.7 C) (Oral)   Resp 16   LMP 04/13/2024   SpO2 99%  Physical Exam Vitals and nursing note reviewed.  Constitutional:      General: She is not in acute distress.    Appearance: She is well-developed.  HENT:     Head: Normocephalic and atraumatic.     Comments: Left pupil 5mm. Right WNL.  Eyes:     Conjunctiva/sclera: Conjunctivae normal.    Cardiovascular:     Rate and Rhythm: Normal rate and regular rhythm.     Heart sounds: No murmur heard. Pulmonary:     Effort: Pulmonary effort is normal. No respiratory distress.     Breath sounds: Normal breath sounds.  Abdominal:     General: There is no distension.     Palpations: Abdomen is  soft.     Tenderness: There is no abdominal tenderness. There is no right CVA tenderness or left CVA tenderness.   Musculoskeletal:        General: No swelling or tenderness. Normal range of motion.     Cervical back: Neck supple.   Skin:    General: Skin is warm and dry.   Neurological:     General: No focal deficit present.     Mental Status: She is alert and oriented to person, place, and time. Mental status is at baseline.     Cranial Nerves: No cranial nerve deficit.      ED Course/ Medical Decision Making/ A&P    Procedures Procedures   Medications Ordered in ED Medications  tetracaine  (PONTOCAINE) 0.5 % ophthalmic solution 1 drop (1 drop Right Eye Given 04/24/24 2356)  iohexol  (OMNIPAQUE ) 350 MG/ML injection 75 mL (75 mLs Intravenous Contrast Given 04/25/24 0058)    Medical Decision Making:   Erick Oxendine is a 28 y.o. female who presented to the ED today with blurry vision and left pupillary dilation detailed above.    Additional history discussed with patient's family/caregivers.  Patient placed on continuous vitals and telemetry monitoring while in ED which was reviewed periodically.  Complete initial physical exam performed, notably the patient  was hemodynamically stable no acute distress.  Left pupil approximately 5 mm while right is 2 mm.  Minimal light response.   IOP 17 mm water with 95% confidence interval.  Full range of motion. Reviewed and confirmed nursing documentation for past medical history, family history, social history.    Initial Assessment:   With the patient's presentation of pupillary dilation and blurry vision, most likely diagnosis is peripheral neuropathy. Other diagnoses were considered including (but not limited to) multiple sclerosis, intracranial aneurysm, intracranial mass, intracranial hemorrhage, glaucoma. These are considered less likely due to history of present illness and physical exam findings.   This is most consistent with an  acute life/limb threatening illness complicated by underlying chronic conditions.  Initial Plan:  CTA head and neck to evaluate for vascular pathology/dissection Screening labs including CBC and Metabolic panel to evaluate for infectious or metabolic etiology of disease.  Urinalysis with reflex culture ordered to evaluate for UTI or relevant urologic/nephrologic pathology.  EKG to evaluate for cardiac pathology Objective evaluation as below reviewed   Initial Study Results:   Laboratory  All laboratory results reviewed without evidence of clinically relevant pathology.     EKG EKG was reviewed independently. Rate, rhythm, axis, intervals all examined and without medically relevant abnormality. ST segments without concerns for elevations.    Radiology:  All images reviewed independently. Agree with radiology report at this time.   CT ANGIO HEAD NECK W WO CM Result Date: 04/25/2024 CLINICAL DATA:  Initial evaluation for acute neuro deficit, stroke suspected. EXAM: CT ANGIOGRAPHY HEAD AND NECK WITH AND WITHOUT CONTRAST TECHNIQUE: Multidetector CT imaging of the head and neck was performed using the standard protocol during bolus administration of intravenous contrast. Multiplanar CT image reconstructions and MIPs were obtained to evaluate the vascular anatomy. Carotid stenosis measurements (when applicable) are obtained utilizing NASCET criteria, using the distal internal carotid diameter as the denominator. RADIATION DOSE REDUCTION: This exam was performed according to the departmental dose-optimization program which includes automated exposure control, adjustment of the mA and/or kV according to patient size and/or use of iterative reconstruction technique. CONTRAST:  75mL OMNIPAQUE  IOHEXOL  350 MG/ML SOLN COMPARISON:  None Available. FINDINGS: CT HEAD FINDINGS Brain: Cerebral volume within normal limits for patient age. No acute intracranial hemorrhage. No acute large vessel territory infarct. No  mass lesion, midline shift, or mass effect. Ventricles are normal in size without hydrocephalus. No extra-axial fluid collection. Vascular: No abnormal hyperdense vessel. Skull: Scalp soft tissues demonstrate no acute abnormality. Calvarium intact. Sinuses/Orbits: Globes and orbital soft tissues within normal limits. Visualized paranasal sinuses are largely clear. No significant mastoid effusion. CTA NECK FINDINGS Aortic arch: Standard branching. Imaged portion shows no evidence of aneurysm or dissection. No significant stenosis of the major arch vessel origins. Right carotid system: No evidence of dissection, stenosis (50% or greater), or occlusion. Left carotid system: No evidence of dissection, stenosis (50% or greater), or occlusion. Vertebral arteries: No evidence of dissection, stenosis (50% or greater), or occlusion. Skeleton: No discrete or worrisome osseous lesions. Other neck: No other acute finding. 4 mm left thyroid nodule noted (series 10, image 53). This is of doubtful significance given size, with no follow-up imaging recommended (ref: J Am Coll Radiol. 2015 Feb;12(2): 143-50). Upper chest: No other acute finding. Review of the MIP images confirms the above findings CTA HEAD FINDINGS Anterior circulation: Both internal carotid arteries are patent to the termini without stenosis. A1 segments patent bilaterally. Normal anterior communicating artery complex. Anterior cerebral arteries widely patent. Normal in stenosis or occlusion. Distal MCA branches perfused and symmetric. Posterior circulation: Both V4 segments patent without stenosis.  Left vertebral artery slightly dominant. Both PICA patent. Basilar patent without stenosis. Superior cerebellar and posterior cerebral arteries patent bilaterally. Venous sinuses: Patent allowing for timing the contrast bolus. Anatomic variants: None significant.  No aneurysm. Review of the MIP images confirms the above findings IMPRESSION: 1. Normal CTA of the head and  neck. No large vessel occlusion, hemodynamically significant stenosis, or other acute vascular abnormality. No aneurysm. 2. No other acute intracranial abnormality. Electronically Signed   By: Virgia Griffins M.D.   On: 04/25/2024 01:30      Consults: Case discussed with on-call for neurology.  They recommended adding in an MRI with and without contrast to evaluate for multiple sclerosis or mass.  If negative then patient would be able to follow-up with outpatient ophthalmology.  Reassessment and Plan:   Studies available at this facility are grossly negative.  Discussed with team at [facility] who accepted patient in transfer for completion of the MRI requested by neurology.  Their recommendations were passed along to the team at the [facility].   Patient to transfer via POV.  Clinical Impression:  1. Blurry vision      Transfer via POV   Final Clinical Impression(s) / ED Diagnoses Final diagnoses:  Blurry vision    Rx / DC Orders ED Discharge Orders     None         Onetha Bile, MD 04/25/24 [phone]

## 2024-04-24 NOTE — ED Notes (Signed)
 Pt to go to [facility] POV for MRI.

## 2024-04-24 NOTE — ED Triage Notes (Addendum)
 UN EVEN PUPIL Started around 8:30 Hx of elevated eye pressure Blurry vision in left eye Steady gait to exam room  Provider notified, patient placed in room

## 2024-04-25 ENCOUNTER — Emergency Department (HOSPITAL_COMMUNITY)

## 2024-04-25 ENCOUNTER — Emergency Department (HOSPITAL_BASED_OUTPATIENT_CLINIC_OR_DEPARTMENT_OTHER)

## 2024-04-25 LAB — COMPREHENSIVE METABOLIC PANEL WITH GFR
ALT: 18 U/L (ref 0–44)
AST: 16 U/L (ref 15–41)
Albumin: 4.5 g/dL (ref 3.5–5.0)
Alkaline Phosphatase: 68 U/L (ref 38–126)
Anion gap: 10 (ref 5–15)
BUN: 15 mg/dL (ref 6–20)
CO2: 28 mmol/L (ref 22–32)
Calcium: 9.6 mg/dL (ref 8.9–10.3)
Chloride: 102 mmol/L (ref 98–111)
Creatinine, Ser: 0.65 mg/dL (ref 0.44–1.00)
GFR, Estimated: >60 mL/min (ref >60)
Glucose, Bld: 89 mg/dL (ref 70–99)
Potassium: 4.1 mmol/L (ref 3.5–5.1)
Sodium: 140 mmol/L (ref 135–145)
Total Bilirubin: 0.3 mg/dL (ref 0.0–1.2)
Total Protein: 7 g/dL (ref 6.5–8.1)

## 2024-04-25 LAB — PREGNANCY, URINE: Preg Test, Ur: NEGATIVE

## 2024-04-25 MED ORDER — IOHEXOL 350 MG/ML SOLN
75.0000 mL | Freq: Once | INTRAVENOUS | Status: AC | PRN
  Administered 2024-04-25: 75 mL via INTRAVENOUS

## 2024-04-25 MED ORDER — GADOBUTROL 1 MMOL/ML IV SOLN
6.0000 mL | Freq: Once | INTRAVENOUS | Status: AC | PRN
  Administered 2024-04-25: 6 mL via INTRAVENOUS

## 2024-04-25 NOTE — ED Notes (Addendum)
 PT came to [facility] instead of [facility]. She is being seen by EDP, Monique Ano. Vitals stable. No new complaints. A&O x 4. She has been updated on where she needs to go for MRI. Bridgette Campus, Consulting civil engineer at [employer] updated and notified.

## 2024-04-25 NOTE — ED Provider Notes (Signed)
 Pt accidentally presented to [facility] ED for MRI.  Pt stable, has dilated left pupil.  Pt may go to [facility] ED for planned MRI.  [facility] updated of delay in pt arrival.   Kelsey Patricia, MD 04/25/24 0430

## 2024-04-25 NOTE — Discharge Instructions (Addendum)
 It was a pleasure taking part in your care.  As discussed, the MRI of your brain does not show any evidence of acute strokes or other acute causes of your dilated pupil.  Please follow-up with Dr. Alto Atta of ophthalmology.  Please call in the morning and make an appointment to be seen.  If you develop any new or worsening symptoms such as one-sided weakness or numbness, slurred speech, gait instability please return to the ED for further management.  Please read attached guide concerning blurred vision.

## 2024-04-25 NOTE — ED Provider Notes (Addendum)
  Physical Exam  BP (!) 117/91 (BP Location: Right Arm)   Pulse 93   Temp 98.1 F (36.7 C)   Resp 19   LMP 04/13/2024   SpO2 98%   Physical Exam Vitals and nursing note reviewed.  Constitutional:      General: She is not in acute distress.    Appearance: She is not toxic-appearing.  HENT:     Head: Normocephalic and atraumatic.   Eyes:     Comments: Left pupil 5 mm, right WNL  Pulmonary:     Effort: No respiratory distress.   Skin:    Coloration: Skin is not jaundiced or pale.   Neurological:     Mental Status: She is alert and oriented to person, place, and time.   Psychiatric:        Behavior: Behavior normal.     Procedures  Procedures  ED Course / MDM    Medical Decision Making Amount and/or Complexity of Data Reviewed Labs: ordered. Radiology: ordered.  Risk Prescription drug management.   28 year old female seen at outside hospital and transferred to [facility] for MRI presents for evaluation.  Please see the previous provider note for further details.  In short, 28 year old female who noted tonight that her left pupil was larger than the right.  States that initially she had some blurred vision and pressure upon her left eye but reports this is decreased since arriving here in the ED.  States that the pupil is also become less dilated since arriving here in the ED.  Based on previous provider note, plan will be to obtain MRI.  If MRI unremarkable, patient will follow-up outpatient ophthalmology.  On my examination, patient has no complaints.  Patient left pupil is still dilated to 7 mm, 3 mm on the right.  Left pupil not responsive to direct or consensual light.           Adel Aden, PA-C 04/25/24 0455    Long, Joshua G, MD 04/25/24 [phone]

## 2024-04-25 NOTE — ED Notes (Signed)
 Pt out of ED to [facility] for MRI via POV with significant other, all belongings, and facesheet

## 2024-09-23 LAB — HM PAP SMEAR: HM Pap smear: NEGATIVE

## 2024-10-23 ENCOUNTER — Encounter: Payer: Self-pay | Admitting: Family Medicine

## 2024-10-23 ENCOUNTER — Ambulatory Visit: Admitting: Family Medicine

## 2024-10-23 ENCOUNTER — Ambulatory Visit

## 2024-10-23 VITALS — BP 108/68 | HR 71 | Temp 98.4°F | Ht 65.0 in | Wt 133.0 lb

## 2024-10-23 DIAGNOSIS — Z7689 Persons encountering health services in other specified circumstances: Secondary | ICD-10-CM

## 2024-10-23 DIAGNOSIS — M25511 Pain in right shoulder: Secondary | ICD-10-CM | POA: Diagnosis not present

## 2024-10-23 DIAGNOSIS — G8929 Other chronic pain: Secondary | ICD-10-CM

## 2024-10-23 MED ORDER — METHOCARBAMOL 500 MG PO TABS: 500.0000 mg | ORAL_TABLET | Freq: Three times a day (TID) | ORAL | 0 refills | Status: AC | PRN

## 2024-10-23 MED ORDER — PREDNISONE 10 MG PO TABS: ORAL_TABLET | ORAL | 0 refills | Status: AC

## 2024-10-23 NOTE — Progress Notes (Signed)
 New Patient Office Visit  Subjective   Patient ID: Elaine Robertson, female    DOB: 12-22-1995  Age: 28 y.o. MRN: [id number]  CC:  Chief Complaint  Patient presents with   Establish Care    HPI [name] presents to establish care with new provider.   Patients previous primary care provider: None   Specialist: None   Patient is complaining of right shoulder pain. Started about 8-9 months ago. Pain described as tearing, grinding feeling. Constant pain. Started out intermittent Pain is worse when sleeping on it. Exercise does not seem to make it worse. Exercise: yoga and light weights. Light heat application will help momentarily. She will sometimes take Ibuprofen with some mild relief.   Outpatient Encounter Medications as of 10/23/2024  Medication Sig   methocarbamol (ROBAXIN) 500 MG tablet Take 1 tablet (500 mg total) by mouth every 8 (eight) hours as needed for up to 7 days.   predniSONE (DELTASONE) 10 MG tablet Take 6 tablets (60 mg total) by mouth daily with breakfast for 1 day, THEN 5 tablets (50 mg total) daily with breakfast for 1 day, THEN 4 tablets (40 mg total) daily with breakfast for 1 day, THEN 3 tablets (30 mg total) daily with breakfast for 1 day, THEN 2 tablets (20 mg total) daily with breakfast for 1 day, THEN 1 tablet (10 mg total) daily with breakfast for 1 day.   [DISCONTINUED] etonogestrel -ethinyl estradiol  (NUVARING) 0.12-0.015 MG/24HR vaginal ring Place 1 each vaginally every 28 (twenty-eight) days. Insert vaginally and leave in place for 3 consecutive weeks, then remove for 1 week.   [DISCONTINUED] FLUoxetine  (PROZAC ) 20 MG capsule Take 3 capsules (60 mg total) by mouth daily.   [DISCONTINUED] ondansetron  (ZOFRAN ) 4 MG tablet Take 1 tablet (4 mg total) by mouth every 6 (six) hours.   No facility-administered encounter medications on file as of 10/23/2024.    Past Medical History:  Diagnosis Date   Amenorrhea    Bilateral ovarian cysts    COVID  05/2021   Depression    Dysmenorrhea    Generalized anxiety disorder    PID (pelvic inflammatory disease) 03/2018   STD testing neg    History reviewed. No pertinent surgical history.  Family History  Problem Relation Age of Onset   Breast cancer Mother 51       she well   Cancer Mother    Anxiety disorder Mother    Diabetes Father        type 1   Alcohol abuse Father    Bipolar disorder Father    Depression Father    Hypertension Maternal Grandfather    Ovarian cancer Paternal Grandmother 101       mets and thought began in ovary   Cancer Paternal Grandfather 54       pancreatic    Social History   Socioeconomic History   Marital status: Single    Spouse name: Not on file   Number of children: 0   Years of education: Not on file   Highest education level: Master's degree (e.g., MA, MS, MEng, MEd, MSW, MBA)  Occupational History   Occupation: student  Tobacco Use   Smoking status: Never   Smokeless tobacco: Never  Vaping Use   Vaping status: Never Used  Substance and Sexual Activity   Alcohol use: Yes    Alcohol/week: 1.0 standard drink of alcohol    Types: 1 Cans of beer per week    Comment: Rare alcohol use.  Drug use: Never   Sexual activity: Yes    Birth control/protection: Coitus interruptus, Condom    Comment: Nuvaring  Other Topics Concern   Not on file  Social History Narrative   Lives in a house off campus with 3 roommates.   Social Drivers of Health   Tobacco Use: Low Risk (10/23/2024)   Patient History    Smoking Tobacco Use: Never    Smokeless Tobacco Use: Never    Passive Exposure: Not on file  Financial Resource Strain: Low Risk (10/20/2024)   Overall Financial Resource Strain (CARDIA)    Difficulty of Paying Living Expenses: Not very hard  Food Insecurity: No Food Insecurity (10/23/2024)   Epic    Worried About Programme Researcher, Broadcasting/film/video in the Last Year: Never true    Ran Out of Food in the Last Year: Never true  Transportation Needs: No  Transportation Needs (10/20/2024)   Epic    Lack of Transportation (Medical): No    Lack of Transportation (Non-Medical): No  Physical Activity: Sufficiently Active (10/20/2024)   Exercise Vital Sign    Days of Exercise per Week: 5 days    Minutes of Exercise per Session: 30 min  Stress: No Stress Concern Present (10/23/2024)   [employer] of Occupational Health - Occupational Stress Questionnaire    Feeling of Stress: Not at all  Social Connections: Moderately Isolated (10/20/2024)   Social Connection and Isolation Panel    Frequency of Communication with Friends and Family: Twice a week    Frequency of Social Gatherings with Friends and Family: Once a week    Attends Religious Services: Patient declined    Active Member of Clubs or Organizations: No    Attends Engineer, Structural: Not on file    Marital Status: Living with partner  Intimate Partner Violence: Not At Risk (10/23/2024)   Epic    Fear of Current or Ex-Partner: No    Emotionally Abused: No    Physically Abused: No    Sexually Abused: No  Depression (PHQ2-9): Low Risk (10/23/2024)   Depression (PHQ2-9)    PHQ-2 Score: 0  Alcohol Screen: Low Risk (10/20/2024)   Alcohol Screen    Last Alcohol Screening Score (AUDIT): 2  Housing: Low Risk (10/20/2024)   Epic    Unable to Pay for Housing in the Last Year: No    Number of Times Moved in the Last Year: 1    Homeless in the Last Year: No  Utilities: Not At Risk (10/23/2024)   Epic    Threatened with loss of utilities: No  Health Literacy: Adequate Health Literacy (10/23/2024)   B1300 Health Literacy    Frequency of need for help with medical instructions: Never    ROS See HPI above    Objective  BP 108/68   Pulse 71   Temp 98.4 F (36.9 C) (Oral)   Ht 5' 5 (1.651 m)   Wt 133 lb (60.3 kg)   SpO2 99%   BMI 22.13 kg/m   Physical Exam Vitals reviewed.  Constitutional:      General: She is not in acute distress.    Appearance: Normal  appearance. She is normal weight. She is not ill-appearing, toxic-appearing or diaphoretic.  HENT:     Head: Normocephalic and atraumatic.  Eyes:     General:        Right eye: No discharge.        Left eye: No discharge.     Conjunctiva/sclera: Conjunctivae normal.  Cardiovascular:     Rate and Rhythm: Normal rate and regular rhythm.     Heart sounds: Normal heart sounds. No murmur heard.    No friction rub. No gallop.  Pulmonary:     Effort: Pulmonary effort is normal. No respiratory distress.     Breath sounds: Normal breath sounds.  Musculoskeletal:        General: Normal range of motion.     Right shoulder: Swelling (Tightness in the anterior upper arm.) present. No deformity or crepitus. Normal range of motion. Normal strength.  Skin:    General: Skin is warm and dry.  Neurological:     General: No focal deficit present.     Mental Status: She is alert and oriented to person, place, and time. Mental status is at baseline.  Psychiatric:        Mood and Affect: Mood normal.        Behavior: Behavior normal.        Thought Content: Thought content normal.        Judgment: Judgment normal.       Assessment & Plan:  Chronic right shoulder pain -     DG Shoulder Right; Future -     predniSONE; Take 6 tablets (60 mg total) by mouth daily with breakfast for 1 day, THEN 5 tablets (50 mg total) daily with breakfast for 1 day, THEN 4 tablets (40 mg total) daily with breakfast for 1 day, THEN 3 tablets (30 mg total) daily with breakfast for 1 day, THEN 2 tablets (20 mg total) daily with breakfast for 1 day, THEN 1 tablet (10 mg total) daily with breakfast for 1 day.  Dispense: 21 tablet; Refill: 0 -     Methocarbamol; Take 1 tablet (500 mg total) by mouth every 8 (eight) hours as needed for up to 7 days.  Dispense: 21 tablet; Refill: 0  Encounter to establish care  1.Review health maintenance:  -HPV vaccine: Completed series-reports  -Tdap: 2018; [facility]   -Cervical Cancer Screening: [facility] about 1 month ago; 09/23/2024  -Covid booster: 07/22/2024  2.Ordered right shoulder x-ray due to pain.  3.Prescribed Prednisone 10mg  tablet, 6 day taper for pain and to help decrease any inflammation. Do not take any NSAIDS while taking Prednisone, but may take Tylenol if needed.  4.Prescribed Robaxin 500mg  tablet, take 1 tablet every 8 hours as needed for muscle spasms. Caution: May cause drowsiness.  5. Advised to please send a MyChart once completed taking steroid dose pak with progress of symptoms. May send to orthopedic depending on x-ray results and status of right shoulder pain.  6. Decided against labs today. Last labs collected were within normal limits.  Return in about 1 year (around 10/23/2025) for physical.   Flor Whitacre, NP

## 2024-10-23 NOTE — Patient Instructions (Signed)
-  It was nice to meet you and look forward to taking care of you.  -Ordered right shoulder x-ray due to pain.  -Prescribed Prednisone 10mg  tablet, 6 day taper for pain and to help decrease any inflammation. Do not take any NSAIDS while taking Prednisone, but may take Tylenol if needed.  -Prescribed Robaxin 500mg  tablet, take 1 tablet every 8 hours as needed for muscle spasms. Caution: May cause drowsiness.  -Please send a MyChart once completed taking steroid dose pak with progress of symptoms. May send to orthopedic depending on x-ray results and status of right shoulder pain.  -Follow up in 1 year for a physical.

## 2024-10-28 ENCOUNTER — Encounter: Payer: Self-pay | Admitting: Family Medicine

## 2024-10-30 ENCOUNTER — Ambulatory Visit: Admitting: Family Medicine

## 2024-10-30 ENCOUNTER — Encounter: Payer: Self-pay | Admitting: Family Medicine

## 2024-10-30 ENCOUNTER — Ambulatory Visit: Payer: Self-pay | Admitting: Family Medicine

## 2024-10-30 VITALS — BP 104/62 | HR 83 | Temp 98.4°F | Ht 65.0 in | Wt 133.0 lb

## 2024-10-30 DIAGNOSIS — Z0000 Encounter for general adult medical examination without abnormal findings: Secondary | ICD-10-CM

## 2024-10-30 NOTE — Progress Notes (Signed)
 Complete physical exam  Patient: Elaine Robertson   DOB: January 12, 1996   28 y.o. Female  MRN: [id number]  Subjective:    Chief Complaint  Patient presents with   Annual Exam    Elaine Robertson is a 28 y.o. female who presents today for a complete physical exam. She reports consuming a general diet with red meat. 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  She generally feels well. She reports sleeping well. She does not have additional problems to discuss today.    Most recent fall risk assessment:    10/30/2024    3:08 PM  Fall Risk   Falls in the past year? 0  Number falls in past yr: 0  Injury with Fall? 0  Risk for fall due to : No Fall Risks  Follow up Falls evaluation completed     Most recent depression screenings:    10/30/2024    3:08 PM 10/23/2024    2:28 PM  PHQ 2/9 Scores  PHQ - 2 Score 0 0  PHQ- 9 Score 0 0    Vision:Within last year and Dental: No current dental problems and Receives regular dental care  Past Medical History:  Diagnosis Date   Amenorrhea    Bilateral ovarian cysts    COVID 05/2021   Depression    Dysmenorrhea    Generalized anxiety disorder    PID (pelvic inflammatory disease) 03/2018   STD testing neg   No past surgical history on file. Social History[1] Social History   Socioeconomic History   Marital status: Single    Spouse name: Not on file   Number of children: 0   Years of education: Not on file   Highest education level: Master's degree (e.g., MA, MS, MEng, MEd, MSW, MBA)  Occupational History   Occupation: student  Tobacco Use   Smoking status: Never   Smokeless tobacco: Never  Vaping Use   Vaping status: Never Used  Substance and Sexual Activity   Alcohol use: Yes    Alcohol/week: 1.0 standard drink of alcohol    Types: 1 Cans of beer per week    Comment: Rare alcohol use.   Drug use: Never   Sexual activity: Yes    Birth control/protection: Coitus interruptus, Condom    Comment: Nuvaring   Other Topics Concern   Not on file  Social History Narrative   Lives in a house off campus with 3 roommates.   Social Drivers of Health   Tobacco Use: Low Risk (10/30/2024)   Patient History    Smoking Tobacco Use: Never    Smokeless Tobacco Use: Never    Passive Exposure: Not on file  Financial Resource Strain: Low Risk (10/20/2024)   Overall Financial Resource Strain (CARDIA)    Difficulty of Paying Living Expenses: Not very hard  Food Insecurity: No Food Insecurity (10/23/2024)   Epic    Worried About Programme Researcher, Broadcasting/film/video in the Last Year: Never true    Ran Out of Food in the Last Year: Never true  Transportation Needs: No Transportation Needs (10/20/2024)   Epic    Lack of Transportation (Medical): No    Lack of Transportation (Non-Medical): No  Physical Activity: Sufficiently Active (10/20/2024)   Exercise Vital Sign    Days of Exercise per Week: 5 days    Minutes of Exercise per Session: 30 min  Stress: No Stress Concern Present (10/23/2024)   [employer] of Occupational Health - Occupational Stress Questionnaire    Feeling of  Stress: Not at all  Social Connections: Moderately Isolated (10/20/2024)   Social Connection and Isolation Panel    Frequency of Communication with Friends and Family: Twice a week    Frequency of Social Gatherings with Friends and Family: Once a week    Attends Religious Services: Patient declined    Active Member of Clubs or Organizations: No    Attends Engineer, Structural: Not on file    Marital Status: Living with partner  Intimate Partner Violence: Not At Risk (10/23/2024)   Epic    Fear of Current or Ex-Partner: No    Emotionally Abused: No    Physically Abused: No    Sexually Abused: No  Depression (PHQ2-9): Low Risk (10/30/2024)   Depression (PHQ2-9)    PHQ-2 Score: 0  Alcohol Screen: Low Risk (10/20/2024)   Alcohol Screen    Last Alcohol Screening Score (AUDIT): 2  Housing: Low Risk (10/20/2024)   Epic    Unable to  Pay for Housing in the Last Year: No    Number of Times Moved in the Last Year: 1    Homeless in the Last Year: No  Utilities: Not At Risk (10/23/2024)   Epic    Threatened with loss of utilities: No  Health Literacy: Adequate Health Literacy (10/23/2024)   B1300 Health Literacy    Frequency of need for help with medical instructions: Never   Family Status  Relation Name Status   Mother  Alive   Father  Alive   Brother  Alive   MGM  Alive   MGF  Alive   PGM  Deceased   PGF  Deceased  No partnership data on file   Family History  Problem Relation Age of Onset   Breast cancer Mother 36       she well   Cancer Mother    Anxiety disorder Mother    Diabetes Father        type 1   Alcohol abuse Father    Bipolar disorder Father    Depression Father    Hypertension Maternal Grandfather    Ovarian cancer Paternal Grandmother 10       mets and thought began in ovary   Cancer Paternal Grandfather 76       pancreatic   Allergies[2]   Patient Care Team: Billy Philippe SAUNDERS, NP as PCP - [facility]   Show/hide medication list[3]  ROS See HPI above     Objective:   BP 104/62   Pulse 83   Temp 98.4 F (36.9 C) (Oral)   Ht 5' 5 (1.651 m)   Wt 133 lb (60.3 kg)   SpO2 99%   BMI 22.13 kg/m    Physical Exam Vitals reviewed.  Constitutional:      General: She is not in acute distress.    Appearance: Normal appearance. She is normal weight. She is not ill-appearing, toxic-appearing or diaphoretic.  HENT:     Head: Normocephalic and atraumatic.     Right Ear: Tympanic membrane, ear canal and external ear normal. There is no impacted cerumen.     Left Ear: Tympanic membrane, ear canal and external ear normal. There is no impacted cerumen.     Mouth/Throat:     Mouth: Mucous membranes are moist.     Pharynx: Oropharynx is clear. No oropharyngeal exudate or posterior oropharyngeal erythema.  Eyes:     General:        Right eye: No  discharge.  Left eye: No discharge.     Conjunctiva/sclera: Conjunctivae normal.     Pupils: Pupils are equal, round, and reactive to light.  Neck:     Thyroid: No thyromegaly.  Cardiovascular:     Rate and Rhythm: Normal rate and regular rhythm.     Pulses:          Posterior tibial pulses are 2+ on the right side and 2+ on the left side.     Heart sounds: Normal heart sounds. No murmur heard.    No friction rub. No gallop.  Pulmonary:     Effort: Pulmonary effort is normal. No respiratory distress.     Breath sounds: Normal breath sounds.  Abdominal:     General: Abdomen is flat. Bowel sounds are normal. There is no distension.     Palpations: Abdomen is soft. There is no mass.     Tenderness: There is no abdominal tenderness. There is no right CVA tenderness or left CVA tenderness.  Musculoskeletal:        General: Normal range of motion.     Cervical back: Normal range of motion.     Right lower leg: No edema.     Left lower leg: No edema.  Lymphadenopathy:     Cervical: No cervical adenopathy.  Skin:    General: Skin is warm and dry.  Neurological:     General: No focal deficit present.     Mental Status: She is alert and oriented to person, place, and time. Mental status is at baseline.     Motor: No weakness.     Gait: Gait normal.  Psychiatric:        Mood and Affect: Mood normal.        Behavior: Behavior normal.        Thought Content: Thought content normal.        Judgment: Judgment normal.    Last CBC Lab Results  Component Value Date   WBC 6.2 04/24/2024   HGB 12.2 04/24/2024   HCT 36.3 04/24/2024   MCV 92.4 04/24/2024   MCH 31.0 04/24/2024   RDW 12.5 04/24/2024   PLT 184 04/24/2024   Last metabolic panel Lab Results  Component Value Date   GLUCOSE 89 04/24/2024   NA 140 04/24/2024   K 4.1 04/24/2024   CL 102 04/24/2024   CO2 28 04/24/2024   BUN 15 04/24/2024   CREATININE 0.65 04/24/2024   GFRNONAA >60 04/24/2024   CALCIUM 9.6  04/24/2024   PROT 7.0 04/24/2024   ALBUMIN 4.5 04/24/2024   LABGLOB 2.8 09/15/2020   AGRATIO 1.6 09/15/2020   BILITOT 0.3 04/24/2024   ALKPHOS 68 04/24/2024   AST 16 04/24/2024   ALT 18 04/24/2024   ANIONGAP 10 04/24/2024   -Last TSH was normal 1.674 on 08/09/2023.     Assessment & Plan:    Routine Health Maintenance and Physical Exam  Immunization History  Administered Date(s) Administered   HPV 9-valent 06/14/2017   Hepatitis B 12/07/1998, 02/25/1999, 05/16/1999   Influenza,inj,Quad PF,6+ Mos 09/06/2018, 08/07/2019, 07/22/2024   Tdap 04/22/2009    Health Maintenance  Topic Date Due   HPV VACCINES (2 - 3-dose series) 07/12/2017   DTaP/Tdap/Td (2 - Td or Tdap) 04/23/2019   Cervical Cancer Screening (Pap smear)  09/10/2022   COVID-19 Vaccine (1 - 2025-26 season) Never done   Influenza Vaccine  Completed   Hepatitis B Vaccines 19-59 Average Risk  Completed   Hepatitis C Screening  Completed   HIV  Screening  Completed   Pneumococcal Vaccine  Aged Out   Meningococcal B Vaccine  Aged Out    Discussed health benefits of physical activity, and encouraged her to engage in regular exercise appropriate for her age and condition.  Annual physical exam  1.Review health maintenance:  -HPV vaccine: Completed series-reports  -Tdap: 2018; [facility]  -Cervical Cancer Screening: [facility] about 1 month ago; 09/23/2024  -Covid booster: 07/22/2024  2.Reviewed over shoulder x-ray results from previous visit that had just been resulted.  3.Physical exam completed. 4.Through shared decision making, decided to hold on completing labs.  5.Continue a healthy diet and participating in regular exercise.  Return in about 1 year (around 10/30/2025) for physical.     Adalyne Lovick, NP     [1]  Social History Tobacco Use   Smoking status: Never   Smokeless tobacco: Never  Vaping Use   Vaping status: Never Used  Substance Use Topics   Alcohol  use: Yes    Alcohol/week: 1.0 standard drink of alcohol    Types: 1 Cans of beer per week    Comment: Rare alcohol use.   Drug use: Never  [2] No Known Allergies [3]  Outpatient Medications Prior to Visit  Medication Sig   methocarbamol  (ROBAXIN ) 500 MG tablet Take 1 tablet (500 mg total) by mouth every 8 (eight) hours as needed for up to 7 days.   No facility-administered medications prior to visit.

## 2024-10-30 NOTE — Patient Instructions (Signed)
-  It was nice to see you today.  -Reviewed over shoulder x-ray results from previous visit that had just been resulted.  -Physical exam completed. -Through shared decision making, decided to hold on completing labs.  -Continue a healthy diet and participating in regular exercise. -Follow up in 1 year for a physical.

## 2024-12-01 ENCOUNTER — Ambulatory Visit: Admitting: Family Medicine
